# Patient Record
Sex: Male | Born: 1995 | Race: Black or African American | Hispanic: No | Marital: Single | Smoking: Never smoker
Health system: Southern US, Community
[De-identification: ages and names within clinical notes are randomized; demographics above are authoritative.]

## PROBLEM LIST (undated history)

## (undated) DIAGNOSIS — S83519A Sprain of anterior cruciate ligament of unspecified knee, initial encounter: Secondary | ICD-10-CM

## (undated) DIAGNOSIS — T7840XA Allergy, unspecified, initial encounter: Secondary | ICD-10-CM

## (undated) DIAGNOSIS — K219 Gastro-esophageal reflux disease without esophagitis: Secondary | ICD-10-CM

## (undated) DIAGNOSIS — S43109A Unspecified dislocation of unspecified acromioclavicular joint, initial encounter: Secondary | ICD-10-CM

## (undated) HISTORY — PX: SHOULDER ARTHROCENTESIS: SUR47

## (undated) HISTORY — DX: Morbid (severe) obesity due to excess calories: E66.01

## (undated) HISTORY — DX: Allergy, unspecified, initial encounter: T78.40XA

## (undated) HISTORY — PX: LAPAROSCOPIC CHOLECYSTECTOMY: SUR755

---
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
## 2018-08-20 ENCOUNTER — Telehealth: Payer: Self-pay | Admitting: Cardiovascular Disease

## 2018-08-20 NOTE — Telephone Encounter (Signed)
  Dr Seward MethBroadnax from [facility] will be sending over EKG for review. Call back number is [phone]

## 2018-08-20 NOTE — Telephone Encounter (Signed)
Dr Duke Salviaandolph spoke with Frederico HammanN [facility] and they will follow up with patient regarding bradycardia. They will call back if felt cardiology needed after follow up

## 2018-08-31 ENCOUNTER — Telehealth: Payer: Self-pay | Admitting: *Deleted

## 2018-08-31 NOTE — Telephone Encounter (Signed)
[facility] regarding possible follow up appointment/vital signs. Dr Duke Salviaandolph had spoken with NP about patients EKG last week. Dr Duke Salviaandolph recommended they follow up with patient and call our office if anything further needed. Left message to call back

## 2018-09-02 NOTE — Telephone Encounter (Signed)
New message   Per [facility] at [facility] is returning your call.  Please contact Natasha [facility]n[employer]. [phone].  Martie LeeSabrina is going on vacation after today and will not be back until January.

## 2018-09-03 NOTE — Telephone Encounter (Signed)
Left message for Samuel Logan to call back.

## 2018-09-04 NOTE — Telephone Encounter (Signed)
H Meng did review EKG and patient needs new patient visit  Information given to schedulers to arrange consult

## 2018-09-04 NOTE — Telephone Encounter (Signed)
Called had to leave a message to cal back

## 2018-09-17 ENCOUNTER — Telehealth: Payer: Self-pay | Admitting: Cardiovascular Disease

## 2018-09-17 NOTE — Telephone Encounter (Signed)
Called patient and LVM to call back and schedule appt.  

## 2018-09-22 NOTE — Telephone Encounter (Signed)
Patient has new patient appointment scheduled 10/16/18 with Dr Cristal Deer

## 2018-09-25 NOTE — Telephone Encounter (Signed)
Left message with student health appointment made

## 2018-10-16 ENCOUNTER — Encounter: Payer: Self-pay | Admitting: Cardiology

## 2018-10-16 ENCOUNTER — Ambulatory Visit (INDEPENDENT_AMBULATORY_CARE_PROVIDER_SITE_OTHER): Payer: Managed Care, Other (non HMO) | Admitting: Cardiology

## 2018-10-16 ENCOUNTER — Ambulatory Visit: Payer: Managed Care, Other (non HMO) | Admitting: Cardiology

## 2018-10-16 ENCOUNTER — Encounter (INDEPENDENT_AMBULATORY_CARE_PROVIDER_SITE_OTHER): Payer: Self-pay

## 2018-10-16 VITALS — BP 130/68 | HR 73 | Ht 72.0 in | Wt 265.2 lb

## 2018-10-16 DIAGNOSIS — R42 Dizziness and giddiness: Secondary | ICD-10-CM

## 2018-10-16 DIAGNOSIS — R001 Bradycardia, unspecified: Secondary | ICD-10-CM

## 2018-10-16 NOTE — Patient Instructions (Signed)
Medication Instructions:  Your Physician recommend you continue on your current medication as directed.    If you need a refill on your cardiac medications before your next appointment, please call your pharmacy.   Lab work: None  Testing/Procedures: None  Follow-Up: At [facility], you and your health needs are our priority.  As part of our continuing mission to provide you with exceptional heart care, we have created designated Provider Care Teams.  These Care Teams include your primary Cardiologist (physician) and Advanced Practice Providers (APPs -  Physician Assistants and Nurse Practitioners) who all work together to provide you with the care you need, when you need it. You will need a follow up appointment as needed.  Please call our office 2 months in advance to schedule this appointment.  You may see Dr. Christopher or one of the following Advanced Practice Providers on your designated Care Team:   Rhonda Barrett, PA-C . Kathryn Lawrence, DNP, ANP     

## 2018-10-16 NOTE — Progress Notes (Signed)
Cardiology Office Note:    Date:  10/16/2018   ID:  Samuel Logan, DOB July 04, 1996, MRN [id number]  PCP:  Santa Genera, MD  Cardiologist:  Jodelle Red, MD PhD  Referring MD: Georgia Lopes, FNP   CC: consult for bradycardia  History of Present Illness:    Samuel Logan is a 23 y.o. male without significant PMH who is seen as a new consult at the request of Georgia Lopes, FNP for the evaluation and management of bradycardia.  Was walking across campus in early December, felt lightheaded/like he might almost pass out walking to the student health [facility]. Had a headache, nausea as well. No upper respiratory symptoms. He was stressed, lack of sleep, working out for football at the time. He also endorsed feeling a sharp chest discomfort around the same time, occurred when he was lying in bed, better when he was working out He had an ECG done at the time, which showed sinus bradycardia at a rate of 46 bpm. Dr. Duke Salvia was called, who recommended repeat ECG/vitals at the health center in a week. This was done, repeat ECG sinus bradycardia at 51 bpm. He has been avoiding working out for football since that time.  No syncope since childhood. No further chest pain. Only heart racing when he is stressed out/before a big game. Denies shortness of breath at rest or with normal exertion. No PND, orthopnea, LE edema or unexpected weight gain.  All his grandparents have high blood pressures, as well as both of his parents.   Past Medical History:  Diagnosis Date  . AC separation 04/2012   left shoulder   . Acid reflux    no current med.  . ACL tear 06/12/2012   left knee    Past Surgical History:  Procedure Laterality Date  . LAPAROSCOPIC CHOLECYSTECTOMY  age 8    Current Medications: Current Outpatient Medications on File Prior to Visit  Medication Sig  . ibuprofen (ADVIL,MOTRIN) 800 MG tablet Take 1 tablet (800 mg total) by mouth every 8 (eight) hours as needed.   No  current facility-administered medications on file prior to visit.      Allergies:   Patient has no known allergies.   Social History   Socioeconomic History  . Marital status: Single    Spouse name: Not on file  . Number of children: Not on file  . Years of education: Not on file  . Highest education level: Not on file  Occupational History  . Not on file  Social Needs  . Financial resource strain: Not on file  . Food insecurity:    Worry: Not on file    Inability: Not on file  . Transportation needs:    Medical: Not on file    Non-medical: Not on file  Tobacco Use  . Smoking status: Passive Smoke Exposure - Never Smoker  . Smokeless tobacco: Never Used  . Tobacco comment: father smokes outside  Substance and Sexual Activity  . Alcohol use: No  . Drug use: No  . Sexual activity: Not on file  Lifestyle  . Physical activity:    Days per week: Not on file    Minutes per session: Not on file  . Stress: Not on file  Relationships  . Social connections:    Talks on phone: Not on file    Gets together: Not on file    Attends religious service: Not on file    Active member of club or organization: Not on file  Attends meetings of clubs or organizations: Not on file    Relationship status: Not on file  Other Topics Concern  . Not on file  Social History Narrative  . Not on file     Family History: The patient's family history includes Anesthesia problems in his father; Diabetes in his mother; Hypertension in his father and mother.  ROS:   Please see the history of present illness.  Additional pertinent ROS:  Constitutional: Negative for chills, fever, night sweats, unintentional weight loss  HENT: Negative for ear pain and hearing loss.   Eyes: Negative for loss of vision and eye pain.  Respiratory: Negative for cough, sputum, shortness of breath, wheezing.   Cardiovascular: Negative for chest pain, palpitations, PND, orthopnea, lower extremity edema and  claudication.  Gastrointestinal: Negative for abdominal pain, melena, and hematochezia.  Genitourinary: Negative for dysuria and hematuria.  Musculoskeletal: Negative for falls and myalgias.  Skin: Negative for itching and rash.  Neurological: Negative for focal weakness, focal sensory changes and loss of consciousness.  Endo/Heme/Allergies: Does not bruise/bleed easily.    EKGs/Labs/Other Studies Reviewed:    The following studies were reviewed today: 2 ECGS and [facility]  EKG:  EKG is personally reviewed.  The ekg ordered today demonstrates NSR, HR 73 bpm. Young age related repol pattern  Recent Labs: No results found for requested labs within last 8760 hours.  Recent Lipid Panel No results found for: CHOL, TRIG, HDL, CHOLHDL, VLDL, LDLCALC, LDLDIRECT  Physical Exam:    VS:  BP 130/68   Pulse 73   Ht 6' (1.829 m)   Wt 265 lb 3.2 oz (120.3 kg)   BMI 35.97 kg/m     Wt Readings from Last 3 Encounters:  10/16/18 265 lb 3.2 oz (120.3 kg)  05/14/14 218 lb 12.8 oz (99.2 kg) (99 %, Z= 2.20)*  03/25/13 205 lb (93 kg) (99 %, Z= 2.21)*   * Growth percentiles are based on CDC (Boys, 2-20 Years) data.     GEN: Well nourished, well developed in no acute distress HEENT: Normal NECK: No JVD; No carotid bruits LYMPHATICS: No lymphadenopathy CARDIAC: regular rhythm, normal S1 and S2, no murmurs, rubs, gallops. Radial and DP pulses 2+ bilaterally. RESPIRATORY:  Clear to auscultation without rales, wheezing or rhonchi  ABDOMEN: Soft, non-tender, non-distended MUSCULOSKELETAL:  No edema; No deformity  SKIN: Warm and dry NEUROLOGIC:  Alert and oriented x 3 PSYCHIATRIC:  Normal affect   ASSESSMENT:    1. Bradycardia   2. Episodic lightheadedness    PLAN:    Episode of lightheadedness, with noted sinus bradycardia: symptoms have resolved. No evidence of high grade conduction disease. No associated syncope or high risk symptoms. NSR today at 73 bpm. His  heart rate has gradually increased while abstaining from football workouts, which support that he has high resting vagal tone due to his age and athleticism.  We discussed multiple options for evaluation, and together it was decided that we will monitor his symptoms as he returns to activity. If symptoms recur, we will pursue a monitor to evaluate his rhythm patterns further. He will call us if he has any questions or concerns.  Plan for follow up: as needed  Medication Adjustments/Labs and Tests Ordered: Current medicines are reviewed at length with the patient today.  Concerns regarding medicines are outlined above.  Orders Placed This Encounter  Procedures  . EKG 12-Lead   No orders of the defined types were placed in this encounter.  Patient Instructions  Medication Instructions:  Your Physician recommend you continue on your current medication as directed.     If you need a refill on your cardiac medications before your next appointment, please call your pharmacy.   Lab work: None  Testing/Procedures: None    Follow-Up: At [facility], you and your health needs are our priority.  As part of our continuing mission to provide you with exceptional heart care, we have created designated Provider Care Teams.  These Care Teams include your primary Cardiologist (physician) and Advanced Practice Providers (APPs -  Physician Assistants and Nurse Practitioners) who all work together to provide you with the care you need, when you need it. You will need a follow up appointment as needed.  Please call our office 2 months in advance to schedule this appointment.  You may see Dr. Cristal Deer or one of the following Advanced Practice Providers on your designated Care Team:   Theodore Demark, PA-C . Joni Reining, DNP, ANP        Signed, Jodelle Red, MD PhD 10/16/2018 4:56 PM    [facility]

## 2018-10-22 ENCOUNTER — Ambulatory Visit: Payer: Managed Care, Other (non HMO) | Admitting: Cardiology

## 2018-11-13 ENCOUNTER — Encounter

## 2019-01-12 ENCOUNTER — Telehealth: Payer: Self-pay | Admitting: Emergency Medicine

## 2019-01-12 ENCOUNTER — Ambulatory Visit: Payer: Managed Care, Other (non HMO) | Admitting: Emergency Medicine

## 2019-01-12 NOTE — Telephone Encounter (Signed)
01/12/2019 - PATIENT HAD AN OFFICE VISIT SCHEDULED TO ESTABLISH CARE WITH DR. Irving Shows ON Tuesday 01/12/2019 AT 9:20am. HE WAS A NO-SHOW. I TRIED TO CALL AND RESCHEDULE BUT HAD TO LEAVE HIM A VOICE MAIL TO RETURN OUR CALL. MBC

## 2019-01-26 ENCOUNTER — Telehealth (INDEPENDENT_AMBULATORY_CARE_PROVIDER_SITE_OTHER): Payer: Managed Care, Other (non HMO) | Admitting: Emergency Medicine

## 2019-01-26 ENCOUNTER — Other Ambulatory Visit: Payer: Self-pay

## 2019-01-26 ENCOUNTER — Encounter: Payer: Self-pay | Admitting: Emergency Medicine

## 2019-01-26 DIAGNOSIS — Z7689 Persons encountering health services in other specified circumstances: Secondary | ICD-10-CM

## 2019-01-26 NOTE — Progress Notes (Signed)
Contacted patient to triage for appointment. Patient states he had a wisdom tooth pulled back in March 2020, it got infected, but no problem now. Patient wants to establish care, he has no complaints.

## 2019-01-26 NOTE — Progress Notes (Signed)
Telemedicine Encounter- SOAP NOTE Established Patient  This telephone encounter was conducted with the patient's (or proxy's) verbal consent via audio telecommunications: yes/no: Yes Patient was instructed to have this encounter in a suitably private space; and to only have persons present to whom they give permission to participate. In addition, patient identity was confirmed by use of name plus two identifiers (DOB and address).  I discussed the limitations, risks, security and privacy concerns of performing an evaluation and management service by telephone and the availability of in person appointments. I also discussed with the patient that there may be a patient responsible charge related to this service. The patient expressed understanding and agreed to proceed.  I spent a total of TIME; 0 MIN TO 60 MIN: 10 minutes talking with the patient or their proxy.  No chief complaint on file. Encounter to establish care  Subjective   Samuel Logan is a 23 y.o. male patient. Telephone visit today to establish care with me. No chronic medical problems.  No chronic medications.  Has no complaints or medical concerns today.  HPI   Patient Active Problem List   Diagnosis Date Noted  . Gait abnormality 11/17/2012  . ACL (anterior cruciate ligament) tear 11/17/2012  . Toe pain, left 09/04/2012  . Acromioclavicular joint pain 09/04/2012    Past Medical History:  Diagnosis Date  . AC separation 04/2012   left shoulder   . Acid reflux    no current med.  . ACL tear 06/12/2012   left knee    Current Outpatient Medications  Medication Sig Dispense Refill  . ibuprofen (ADVIL,MOTRIN) 800 MG tablet Take 1 tablet (800 mg total) by mouth every 8 (eight) hours as needed. (Patient not taking: Reported on 01/26/2019) 21 tablet 0   No current facility-administered medications for this visit.     No Known Allergies  Social History   Socioeconomic History  . Marital status: Single    Spouse  name: Not on file  . Number of children: Not on file  . Years of education: Not on file  . Highest education level: Not on file  Occupational History  . Not on file  Social Needs  . Financial resource strain: Not on file  . Food insecurity:    Worry: Not on file    Inability: Not on file  . Transportation needs:    Medical: Not on file    Non-medical: Not on file  Tobacco Use  . Smoking status: Passive Smoke Exposure - Never Smoker  . Smokeless tobacco: Never Used  . Tobacco comment: father smokes outside  Substance and Sexual Activity  . Alcohol use: No  . Drug use: No  . Sexual activity: Not on file  Lifestyle  . Physical activity:    Days per week: Not on file    Minutes per session: Not on file  . Stress: Not on file  Relationships  . Social connections:    Talks on phone: Not on file    Gets together: Not on file    Attends religious service: Not on file    Active member of club or organization: Not on file    Attends meetings of clubs or organizations: Not on file    Relationship status: Not on file  . Intimate partner violence:    Fear of current or ex partner: Not on file    Emotionally abused: Not on file    Physically abused: Not on file    Forced sexual  activity: Not on file  Other Topics Concern  . Not on file  Social History Narrative  . Not on file    Review of Systems  Constitutional: Negative.  Negative for chills, fever and weight loss.  HENT: Negative.  Negative for congestion, nosebleeds and sore throat.   Eyes: Negative.  Negative for blurred vision and double vision.  Respiratory: Negative.  Negative for cough and shortness of breath.   Cardiovascular: Negative.  Negative for chest pain and palpitations.  Gastrointestinal: Negative.  Negative for abdominal pain, blood in stool, diarrhea, melena, nausea and vomiting.  Genitourinary: Negative.   Musculoskeletal: Negative.  Negative for myalgias.  Skin: Negative.  Negative for rash.   Neurological: Negative.  Negative for dizziness and headaches.  All other systems reviewed and are negative.   Objective   Vitals as reported by the patient: None available 6 feet   weight 250 pounds Awake and oriented x3 in no apparent respiratory distress There were no vitals filed for this visit. Diagnoses and all orders for this visit:  Encounter to establish care   Clinically stable.  No medical concerns identified on today's visit.  There are no diagnoses linked to this encounter.   I discussed the assessment and treatment plan with the patient. The patient was provided an opportunity to ask questions and all were answered. The patient agreed with the plan and demonstrated an understanding of the instructions.   The patient was advised to call back or seek an in-person evaluation if the symptoms worsen or if the condition fails to improve as anticipated.  I provided 10 minutes of non-face-to-face time during this encounter.  Georgina Quint, MD  Primary Care at [facility]

## 2019-02-24 ENCOUNTER — Other Ambulatory Visit (HOSPITAL_COMMUNITY)
Admission: RE | Admit: 2019-02-24 | Discharge: 2019-02-24 | Disposition: A | Discharge status: Home / Self Care | Payer: Managed Care, Other (non HMO) | Source: Ambulatory Visit | Attending: Emergency Medicine | Admitting: Emergency Medicine

## 2019-02-24 ENCOUNTER — Ambulatory Visit (INDEPENDENT_AMBULATORY_CARE_PROVIDER_SITE_OTHER): Payer: Managed Care, Other (non HMO) | Admitting: Emergency Medicine

## 2019-02-24 ENCOUNTER — Encounter: Payer: Self-pay | Admitting: Emergency Medicine

## 2019-02-24 ENCOUNTER — Other Ambulatory Visit: Payer: Self-pay

## 2019-02-24 VITALS — BP 122/63 | HR 60 | Temp 98.8°F | Resp 16 | Ht 72.25 in | Wt 254.4 lb

## 2019-02-24 DIAGNOSIS — Z130 Encounter for screening for diseases of the blood and blood-forming organs and certain disorders involving the immune mechanism: Secondary | ICD-10-CM

## 2019-02-24 DIAGNOSIS — Z202 Contact with and (suspected) exposure to infections with a predominantly sexual mode of transmission: Secondary | ICD-10-CM | POA: Insufficient documentation

## 2019-02-24 DIAGNOSIS — Z0000 Encounter for general adult medical examination without abnormal findings: Secondary | ICD-10-CM

## 2019-02-24 DIAGNOSIS — Z13220 Encounter for screening for lipoid disorders: Secondary | ICD-10-CM

## 2019-02-24 DIAGNOSIS — Z1329 Encounter for screening for other suspected endocrine disorder: Secondary | ICD-10-CM

## 2019-02-24 DIAGNOSIS — Z13228 Encounter for screening for other metabolic disorders: Secondary | ICD-10-CM

## 2019-02-24 NOTE — Progress Notes (Signed)
Samuel GriffinsKyin X Logan 23 y.o.   Chief Complaint  Patient presents with  . Annual Exam    per patient wants testing for STD and no exposure    HISTORY OF PRESENT ILLNESS: This is a 23 y.o. male here for annual exam.  No chronic medical problems. Worried and concerned about possible STD exposure.  Asymptomatic. No other complaints or medical concerns.  HPI   Prior to Admission medications   Medication Sig Start Date End Date Taking? Authorizing Provider  ibuprofen (ADVIL,MOTRIN) 800 MG tablet Take 1 tablet (800 mg total) by mouth every 8 (eight) hours as needed. Patient not taking: Reported on 01/26/2019 11/18/15   Charlestine NightLawyer, Christopher, PA-C    No Known Allergies  Patient Active Problem List   Diagnosis Date Noted  . Gait abnormality 11/17/2012  . ACL (anterior cruciate ligament) tear 11/17/2012  . Toe pain, left 09/04/2012  . Acromioclavicular joint pain 09/04/2012    Past Medical History:  Diagnosis Date  . AC separation 04/2012   left shoulder   . Acid reflux    no current med.  . ACL tear 06/12/2012   left knee    Past Surgical History:  Procedure Laterality Date  . LAPAROSCOPIC CHOLECYSTECTOMY  age 89    Social History   Socioeconomic History  . Marital status: Single    Spouse name: Not on file  . Number of children: Not on file  . Years of education: Not on file  . Highest education level: Not on file  Occupational History  . Not on file  Social Needs  . Financial resource strain: Not on file  . Food insecurity:    Worry: Not on file    Inability: Not on file  . Transportation needs:    Medical: Not on file    Non-medical: Not on file  Tobacco Use  . Smoking status: Passive Smoke Exposure - Never Smoker  . Smokeless tobacco: Never Used  . Tobacco comment: father smokes outside  Substance and Sexual Activity  . Alcohol use: Yes    Alcohol/week: 1.0 standard drinks    Types: 1 Standard drinks or equivalent per week    Comment: 0-1 once every to 2 weeks   . Drug use: No  . Sexual activity: Not on file  Lifestyle  . Physical activity:    Days per week: Not on file    Minutes per session: Not on file  . Stress: Not on file  Relationships  . Social connections:    Talks on phone: Not on file    Gets together: Not on file    Attends religious service: Not on file    Active member of club or organization: Not on file    Attends meetings of clubs or organizations: Not on file    Relationship status: Not on file  . Intimate partner violence:    Fear of current or ex partner: Not on file    Emotionally abused: Not on file    Physically abused: Not on file    Forced sexual activity: Not on file  Other Topics Concern  . Not on file  Social History Narrative  . Not on file    Family History  Problem Relation Age of Onset  . Diabetes Mother   . Hypertension Mother   . Hypertension Father   . Anesthesia problems Father        post-op N/V     Review of Systems  Constitutional: Negative.  Negative for chills and  fever.  HENT: Negative.  Negative for sore throat.   Respiratory: Negative.  Negative for cough and shortness of breath.   Cardiovascular: Negative for chest pain and palpitations.  Gastrointestinal: Negative.  Negative for abdominal pain, diarrhea, nausea and vomiting.  Genitourinary: Negative.  Negative for dysuria, flank pain and hematuria.  Musculoskeletal: Negative.  Negative for back pain, myalgias and neck pain.  Skin: Negative.  Negative for rash.  Neurological: Negative.  Negative for dizziness and headaches.  Endo/Heme/Allergies: Negative.   All other systems reviewed and are negative.  Vitals:   02/24/19 1510  BP: 122/63  Pulse: 60  Resp: 16  Temp: 98.8 F (37.1 C)  SpO2: 98%     Physical Exam Vitals signs reviewed.  Constitutional:      Appearance: Normal appearance.  HENT:     Head: Normocephalic and atraumatic.     Nose: Nose normal.  Eyes:     Extraocular Movements: Extraocular movements  intact.     Pupils: Pupils are equal, round, and reactive to light.  Neck:     Musculoskeletal: Normal range of motion and neck supple.  Cardiovascular:     Rate and Rhythm: Normal rate and regular rhythm.     Heart sounds: Normal heart sounds.  Pulmonary:     Effort: Pulmonary effort is normal.     Breath sounds: Normal breath sounds.  Abdominal:     Tenderness: There is no abdominal tenderness.  Musculoskeletal: Normal range of motion.  Skin:    General: Skin is warm and dry.     Capillary Refill: Capillary refill takes less than 2 seconds.  Neurological:     General: No focal deficit present.     Mental Status: He is alert and oriented to person, place, and time.  Psychiatric:        Mood and Affect: Mood normal.        Behavior: Behavior normal.      ASSESSMENT & PLAN: Samuel Logan was seen today for annual exam.  Diagnoses and all orders for this visit:  Routine general medical examination at a health care facility  Possible exposure to STD -     STD Panel -     GC/Chlamydia probe amp ([place])[facility] at [facility]  Screening for deficiency anemia -     CBC with Differential/Platelet  Screening for lipoid disorders -     Lipid panel  Screening for endocrine, metabolic and immunity disorder -     Comprehensive metabolic panel -     Hemoglobin A1c    Patient Instructions       If you have lab work done today you will be contacted with your lab results within the next 2 weeks.  If you have not heard from us then please contact us. The fastest way to get your results is to register for My Chart.   IF you received an x-ray today, you will receive an invoice from [facility]. Please contact [facility] at [phone] with questions or concerns regarding your invoice.   IF you received labwork today, you will receive an invoice from [facility]. Please contact [facility] at [phone] with questions or concerns regarding your invoice.   Our billing staff  will not be able to assist you with questions regarding bills from these companies.  You will be contacted with the lab results as soon as they are available. The fastest way to get your results is to activate your My Chart account. Instructions are located on the last page  of this paperwork. If you have not heard from [place] regarding the results in 2 weeks, please contact this office.      Health Maintenance, Male A healthy lifestyle and preventive care is important for your health and wellness. Ask your health care provider about what schedule of regular examinations is right for you. What should I know about weight and diet? Eat a Healthy Diet  Eat plenty of vegetables, fruits, whole grains, low-fat dairy products, and lean protein.  Do not eat a lot of foods high in solid fats, added sugars, or salt.  Maintain a Healthy Weight Regular exercise can help you achieve or maintain a healthy weight. You should:  Do at least 150 minutes of exercise each week. The exercise should increase your heart rate and make you sweat (moderate-intensity exercise).  Do strength-training exercises at least twice a week. Watch Your Levels of Cholesterol and Blood Lipids  Have your blood tested for lipids and cholesterol every 5 years starting at 21 years of age. If you are at high risk for heart disease, you should start having your blood tested when you are 22 years old. You may need to have your cholesterol levels checked more often if: ? Your lipid or cholesterol levels are high. ? You are older than 21 years of age. ? You are at high risk for heart disease. What should I know about cancer screening? Many types of cancers can be detected early and may often be prevented. Lung Cancer  You should be screened every year for lung cancer if: ? You are a current smoker who has smoked for at least 30 years. ? You are a former smoker who has quit within the past 15 years.  Talk to your health care provider  about your screening options, when you should start screening, and how often you should be screened. Colorectal Cancer  Routine colorectal cancer screening usually begins at 21 years of age and should be repeated every 5-10 years until you are 21 years old. You may need to be screened more often if early forms of precancerous polyps or small growths are found. Your health care provider may recommend screening at an earlier age if you have risk factors for colon cancer.  Your health care provider may recommend using home test kits to check for hidden blood in the stool.  A small camera at the end of a tube can be used to examine your colon (sigmoidoscopy or colonoscopy). This checks for the earliest forms of colorectal cancer. Prostate and Testicular Cancer  Depending on your age and overall health, your health care provider may do certain tests to screen for prostate and testicular cancer.  Talk to your health care provider about any symptoms or concerns you have about testicular or prostate cancer. Skin Cancer  Check your skin from head to toe regularly.  Tell your health care provider about any new moles or changes in moles, especially if: ? There is a change in a mole's size, shape, or color. ? You have a mole that is larger than a pencil eraser.  Always use sunscreen. Apply sunscreen liberally and repeat throughout the day.  Protect yourself by wearing long sleeves, pants, a wide-brimmed hat, and sunglasses when outside. What should I know about heart disease, diabetes, and high blood pressure?  If you are 57-41 years of age, have your blood pressure checked every 3-5 years. If you are 85 years of age or older, have your blood pressure checked every year.  You should have your blood pressure measured twice-once when you are at a hospital or clinic, and once when you are not at a hospital or clinic. Record the average of the two measurements. To check your blood pressure when you are not  at a hospital or clinic, you can use: ? An automated blood pressure machine at a pharmacy. ? A home blood pressure monitor.  Talk to your health care provider about your target blood pressure.  If you are between 5945-21 years old, ask your health care provider if you should take aspirin to prevent heart disease.  Have regular diabetes screenings by checking your fasting blood sugar level. ? If you are at a normal weight and have a low risk for diabetes, have this test once every three years after the age of 21. ? If you are overweight and have a high risk for diabetes, consider being tested at a younger age or more often.  A one-time screening for abdominal aortic aneurysm (AAA) by ultrasound is recommended for men aged 65-75 years who are current or former smokers. What should I know about preventing infection? Hepatitis B If you have a higher risk for hepatitis B, you should be screened for this virus. Talk with your health care provider to find out if you are at risk for hepatitis B infection. Hepatitis C Blood testing is recommended for:  Everyone born from [birth year] through 1963.  Anyone with known risk factors for hepatitis C. Sexually Transmitted Diseases (STDs)  You should be screened each year for STDs including gonorrhea and chlamydia if: ? You are sexually active and are younger than 21 years of age. ? You are older than 21 years of age and your health care provider tells you that you are at risk for this type of infection. ? Your sexual activity has changed since you were last screened and you are at an increased risk for chlamydia or gonorrhea. Ask your health care provider if you are at risk.  Talk with your health care provider about whether you are at high risk of being infected with HIV. Your health care provider may recommend a prescription medicine to help prevent HIV infection. What else can I do?  Schedule regular health, dental, and eye exams.  Stay current with your  vaccines (immunizations).  Do not use any tobacco products, such as cigarettes, chewing tobacco, and e-cigarettes. If you need help quitting, ask your health care provider.  Limit alcohol intake to no more than 2 drinks per day. One drink equals 12 ounces of beer, 5 ounces of wine, or 1 ounces of hard liquor.  Do not use street drugs.  Do not share needles.  Ask your health care provider for help if you need support or information about quitting drugs.  Tell your health care provider if you often feel depressed.  Tell your health care provider if you have ever been abused or do not feel safe at home. This information is not intended to replace advice given to you by your health care provider. Make sure you discuss any questions you have with your health care provider. Document Released: 02/29/2008 Document Revised: 05/01/2016 Document Reviewed: 06/06/2015 Elsevier Interactive Patient Education  2019 Elsevier Inc.      Edwina BarthMiguel Edris Friedt, MD [facility]

## 2019-02-24 NOTE — Patient Instructions (Addendum)

## 2019-02-25 ENCOUNTER — Encounter: Payer: Self-pay | Admitting: Emergency Medicine

## 2019-02-26 LAB — CBC WITH DIFFERENTIAL/PLATELET
Basophils Absolute: 0 10*3/uL (ref 0.0–0.2)
Basos: 1 %
EOS (ABSOLUTE): 0.1 10*3/uL (ref 0.0–0.4)
Eos: 2 %
Hematocrit: 42.9 % (ref 37.5–51.0)
Hemoglobin: 15 g/dL (ref 13.0–17.7)
Immature Grans (Abs): 0 10*3/uL (ref 0.0–0.1)
Immature Granulocytes: 0 %
Lymphocytes Absolute: 1.5 10*3/uL (ref 0.7–3.1)
Lymphs: 36 %
MCH: 30 pg (ref 26.6–33.0)
MCHC: 35 g/dL (ref 31.5–35.7)
MCV: 86 fL (ref 79–97)
Monocytes Absolute: 0.6 10*3/uL (ref 0.1–0.9)
Monocytes: 14 %
Neutrophils Absolute: 1.9 10*3/uL (ref 1.4–7.0)
Neutrophils: 47 %
Platelets: 232 10*3/uL (ref 150–450)
RBC: 5 x10E6/uL (ref 4.14–5.80)
RDW: 13 % (ref 11.6–15.4)
WBC: 4.1 10*3/uL (ref 3.4–10.8)

## 2019-02-26 LAB — HEMOGLOBIN A1C
Est. average glucose Bld gHb Est-mCnc: 105 mg/dL
Hgb A1c MFr Bld: 5.3 % (ref 4.8–5.6)

## 2019-02-26 LAB — COMPREHENSIVE METABOLIC PANEL
ALT: 25 IU/L (ref 0–44)
AST: 32 IU/L (ref 0–40)
Albumin/Globulin Ratio: 1.7 (ref 1.2–2.2)
Albumin: 4.8 g/dL (ref 4.1–5.2)
Alkaline Phosphatase: 110 IU/L (ref 39–117)
BUN/Creatinine Ratio: 14 (ref 9–20)
BUN: 14 mg/dL (ref 6–20)
Bilirubin Total: 0.7 mg/dL (ref 0.0–1.2)
CO2: 21 mmol/L (ref 20–29)
Calcium: 9.6 mg/dL (ref 8.7–10.2)
Chloride: 103 mmol/L (ref 96–106)
Creatinine, Ser: 1.01 mg/dL (ref 0.76–1.27)
GFR calc Af Amer: 122 mL/min/{1.73_m2} (ref >59)
GFR calc non Af Amer: 106 mL/min/{1.73_m2} (ref >59)
Globulin, Total: 2.8 g/dL (ref 1.5–4.5)
Glucose: 87 mg/dL (ref 65–99)
Potassium: 4.4 mmol/L (ref 3.5–5.2)
Sodium: 140 mmol/L (ref 134–144)
Total Protein: 7.6 g/dL (ref 6.0–8.5)

## 2019-02-26 LAB — LIPID PANEL
Chol/HDL Ratio: 4.1 ratio (ref 0.0–5.0)
Cholesterol, Total: 158 mg/dL (ref 100–199)
HDL: 39 mg/dL — ABNORMAL LOW (ref >39)
LDL Calculated: 106 mg/dL — ABNORMAL HIGH (ref 0–99)
Triglycerides: 66 mg/dL (ref 0–149)
VLDL Cholesterol Cal: 13 mg/dL (ref 5–40)

## 2019-02-26 LAB — RPR+HSVIGM+HBSAG+HSV2(IGG)+...
HIV Screen 4th Generation wRfx: NONREACTIVE
HSV 2 IgG, Type Spec: <0.91 index (ref 0.00–0.90)
HSVI/II Comb IgM: <0.91 Ratio (ref 0.00–0.90)
Hepatitis B Surface Ag: NEGATIVE
RPR Ser Ql: NONREACTIVE

## 2019-02-26 LAB — GC/CHLAMYDIA PROBE AMP (~~LOC~~) NOT AT ARMC
Chlamydia: NEGATIVE
Neisseria Gonorrhea: NEGATIVE

## 2019-05-03 ENCOUNTER — Other Ambulatory Visit: Payer: Self-pay

## 2019-05-03 DIAGNOSIS — Z20822 Contact with and (suspected) exposure to covid-19: Secondary | ICD-10-CM

## 2019-05-05 LAB — NOVEL CORONAVIRUS, NAA: SARS-CoV-2, NAA: NOT DETECTED

## 2019-05-05 LAB — SPECIMEN STATUS REPORT

## 2019-12-26 ENCOUNTER — Other Ambulatory Visit: Payer: Self-pay

## 2019-12-26 ENCOUNTER — Ambulatory Visit (HOSPITAL_COMMUNITY)
Admission: EM | Admit: 2019-12-26 | Discharge: 2019-12-26 | Disposition: A | Discharge status: Home / Self Care | Payer: 59 | Attending: Physician Assistant | Admitting: Physician Assistant

## 2019-12-26 ENCOUNTER — Encounter (HOSPITAL_COMMUNITY): Payer: Self-pay

## 2019-12-26 DIAGNOSIS — U071 COVID-19: Secondary | ICD-10-CM | POA: Insufficient documentation

## 2019-12-26 DIAGNOSIS — J069 Acute upper respiratory infection, unspecified: Secondary | ICD-10-CM | POA: Diagnosis not present

## 2019-12-26 DIAGNOSIS — Z7722 Contact with and (suspected) exposure to environmental tobacco smoke (acute) (chronic): Secondary | ICD-10-CM | POA: Insufficient documentation

## 2019-12-26 DIAGNOSIS — R519 Headache, unspecified: Secondary | ICD-10-CM | POA: Diagnosis present

## 2019-12-26 LAB — SARS CORONAVIRUS 2 (TAT 6-24 HRS): SARS Coronavirus 2: POSITIVE — AB

## 2019-12-26 MED ORDER — SALINE SPRAY 0.65 % NA SOLN: 1.0000 | NASAL | 0 refills | Status: DC | PRN

## 2019-12-26 MED ORDER — CETIRIZINE HCL 10 MG PO TABS: 10.0000 mg | ORAL_TABLET | Freq: Every day | ORAL | 0 refills | Status: DC

## 2019-12-26 NOTE — ED Triage Notes (Signed)
Pt is here with a headache, chills that started yesterday, pt was COVID tested Friday his results were NEGATIVE. Pt has taken Alza-Selizer daytime pills to relieve discomfort.

## 2019-12-26 NOTE — Discharge Instructions (Signed)
Take ibuprofen and tylenol for your headache and bodyache, you may also continue the alka-seltzer if you feel this works  Start the zyrtec daily for allergy relief  Use the nasal saline daily as much as you like to aid in sinus relief  If your Covid-19 test is positive, you will receive a phone call from [facility] regarding your results. Negative test results are not called. Both positive and negative results area always visible on MyChart. If you do not have a MyChart account, sign up instructions are in your discharge papers.   Persons who are directed to care for themselves at home may discontinue isolation under the following conditions:   At least 10 days have passed since symptom onset and  At least 24 hours have passed without running a fever (this means without the use of fever-reducing medications) and  Other symptoms have improved.  Persons infected with COVID-19 who never develop symptoms may discontinue isolation and other precautions 10 days after the date of their first positive COVID-19 test.

## 2019-12-26 NOTE — ED Provider Notes (Signed)
[facility]    CSN: [id number] Arrival date & time: 12/26/19  1333      History   Chief Complaint Chief Complaint  Patient presents with  . Headache  . Chills    HPI Samuel Logan is a 24 y.o. male.   Patient reports with 1 day history of sinus headache, sinus congestion and runny nose and slight cough.  He also endorses body aches and chills yesterday.  Reports the headache has been in the frontal aspect of his head.  He reports the headache responded well to Alka-Seltzer.  He has had a runny nose and congestion with some mild facial pressure.  He is also been sneezing.  No eye itching or watery eyes.  Has had a slight cough that is nonproductive.  No shortness of breath.  No ear pain or sore throat.  No nausea, vomiting, diarrhea.  No change in taste or smell.  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  This Covid test was negative.  He believes this may have been a rapid test and there are pending PCR results.  He has been around anybody sick.  He is worried he may have a sinus infection.     Past Medical History:  Diagnosis Date  . AC separation 04/2012   left shoulder   . Acid reflux    no current med.  . ACL tear 06/12/2012   left knee  . Allergies     Patient Active Problem List   Diagnosis Date Noted  . Gait abnormality 11/17/2012  . ACL (anterior cruciate ligament) tear 11/17/2012  . Toe pain, left 09/04/2012  . Acromioclavicular joint pain 09/04/2012    Past Surgical History:  Procedure Laterality Date  . LAPAROSCOPIC CHOLECYSTECTOMY  age 68       Home Medications    Prior to Admission medications   Medication Sig Start Date End Date Taking? Authorizing Provider  cetirizine (ZYRTEC ALLERGY) 10 MG tablet Take 1 tablet (10 mg total) by mouth daily. 12/26/19   Milt Coye, Veryl Speak, PA-C  ibuprofen (ADVIL,MOTRIN) 800 MG tablet Take 1 tablet (800 mg total) by mouth every 8 (eight) hours as  needed. Patient not taking: Reported on 01/26/2019 11/18/15   Charlestine Night, PA-C  sodium chloride (OCEAN) 0.65 % SOLN nasal spray Place 1 spray into both nostrils as needed for congestion. 12/26/19   Keiron Iodice, Veryl Speak, PA-C    Family History Family History  Problem Relation Age of Onset  . Diabetes Mother   . Hypertension Mother   . Hypertension Father   . Anesthesia problems Father        post-op N/V  . Diabetes Maternal Grandfather   . Hypertension Maternal Grandfather   . Alcohol abuse Maternal Grandfather        per patient in the pass  . Diabetes Paternal Grandfather     Social History Social History   Tobacco Use  . Smoking status: Passive Smoke Exposure - Never Smoker  . Smokeless tobacco: Never Used  . Tobacco comment: father smokes outside  Substance Use Topics  . Alcohol use: Yes    Alcohol/week: 1.0 standard drinks    Types: 1 Standard drinks or equivalent per week    Comment: 0-1 once every to 2 weeks  . Drug use: No     Allergies   Patient has no known allergies.   Review of Systems Review of Systems  Constitutional: Positive for  chills. Negative for appetite change and fever.  HENT: Positive for congestion, rhinorrhea, sinus pressure and sneezing. Negative for ear pain, hearing loss, postnasal drip, sinus pain and sore throat.   Eyes: Negative for photophobia.  Respiratory: Positive for cough. Negative for chest tightness and shortness of breath.   Cardiovascular: Negative for chest pain.  Gastrointestinal: Negative for abdominal pain, constipation, nausea and vomiting.  Musculoskeletal: Positive for arthralgias and myalgias.  Skin: Negative for color change and rash.  Neurological: Positive for headaches. Negative for dizziness.  All other systems reviewed and are negative.    Physical Exam Triage Vital Signs ED Triage Vitals  Enc Vitals Group     BP 12/26/19 1403 (!) 141/59     Pulse Rate 12/26/19 1403 72     Resp 12/26/19 1403 19     Temp  12/26/19 1403 98.6 F (37 C)     Temp Source 12/26/19 1403 Oral     SpO2 12/26/19 1403 97 %     Weight 12/26/19 1402 260 lb (117.9 kg)     Height --      Head Circumference --      Peak Flow --      Pain Score 12/26/19 1402 0     Pain Loc --      Pain Edu? --      Excl. in GC? --    No data found.  Updated Vital Signs BP (!) 141/59 (BP Location: Right Arm)   Pulse 72   Temp 98.6 F (37 C) (Oral)   Resp 19   Wt 260 lb (117.9 kg)   SpO2 97%   BMI 35.02 kg/m   Visual Acuity Right Eye Distance:   Left Eye Distance:   Bilateral Distance:    Right Eye Near:   Left Eye Near:    Bilateral Near:     Physical Exam Vitals and nursing note reviewed.  Constitutional:      General: He is not in acute distress.    Appearance: He is well-developed. He is not ill-appearing.  HENT:     Head: Normocephalic and atraumatic.     Nose:     Right Sinus: No maxillary sinus tenderness or frontal sinus tenderness.     Left Sinus: No maxillary sinus tenderness or frontal sinus tenderness.     Comments: Turbinates erythematous and swollen with clear nasal discharge bilaterally.    Mouth/Throat:     Mouth: Mucous membranes are moist.     Pharynx: Oropharynx is clear.  Eyes:     Extraocular Movements: Extraocular movements intact.     Conjunctiva/sclera: Conjunctivae normal.     Pupils: Pupils are equal, round, and reactive to light.  Cardiovascular:     Rate and Rhythm: Normal rate and regular rhythm.     Heart sounds: No murmur.  Pulmonary:     Effort: Pulmonary effort is normal. No respiratory distress.     Breath sounds: Normal breath sounds. No wheezing, rhonchi or rales.  Musculoskeletal:     Cervical back: Neck supple.  Lymphadenopathy:     Cervical: No cervical adenopathy.  Skin:    General: Skin is warm and dry.  Neurological:     Mental Status: He is alert and oriented to person, place, and time.      UC Treatments / Results  Labs (all labs ordered are listed, but  only abnormal results are displayed) Labs Reviewed  SARS CORONAVIRUS 2 (TAT 6-24 HRS)    EKG   Radiology No  results found.  Procedures Procedures (including critical care time)  Medications Ordered in UC Medications - No data to display  Initial Impression / Assessment and Plan / UC Course  I have reviewed the triage vital signs and the nursing notes.  Pertinent labs & imaging results that were available during my care of the patient were reviewed by me and considered in my medical decision making (see chart for details).     #Viral URI Patient is 24 year old male presenting with acute viral upper respiratory infection.  He is afebrile and well-appearing.  Given patient had a negative Covid test prior to symptoms beginning did recommend we conduct Covid test today.  Patient does agree to this.  Discussed symptomatic care and that this is more than likely a virus and will not respond to antibiotic therapy.  Discussed continuing of Alka-Seltzer or use of Tylenol and ibuprofen for headache.  Discussed use of nasal saline for congestion.  Discussed starting Zyrtec for allergy symptoms.  Discussed that if not improving or acutely worsening over the next 7-10 days to please return for further evaluation.  Final Clinical Impressions(s) / UC Diagnoses   Final diagnoses:  Viral upper respiratory tract infection     Discharge Instructions     Take ibuprofen and tylenol for your headache and bodyache, you may also continue the alka-seltzer if you feel this works  Start the zyrtec daily for allergy relief  Use the nasal saline daily as much as you like to aid in sinus relief  If your Covid-19 test is positive, you will receive a phone call from [facility] regarding your results. Negative test results are not called. Both positive and negative results area always visible on MyChart. If you do not have a MyChart account, sign up instructions are in your discharge papers.   Persons  who are directed to care for themselves at home may discontinue isolation under the following conditions:  . At least 10 days have passed since symptom onset and . At least 24 hours have passed without running a fever (this means without the use of fever-reducing medications) and . Other symptoms have improved.  Persons infected with COVID-19 who never develop symptoms may discontinue isolation and other precautions 10 days after the date of their first positive COVID-19 test.     ED Prescriptions    Medication Sig Dispense Auth. Provider   cetirizine (ZYRTEC ALLERGY) 10 MG tablet Take 1 tablet (10 mg total) by mouth daily. 30 tablet Marlena Barbato, Marguerita Beards, PA-C   sodium chloride (OCEAN) 0.65 % SOLN nasal spray Place 1 spray into both nostrils as needed for congestion. 60 mL Nicha Hemann, Marguerita Beards, PA-C     PDMP not reviewed this encounter.   Purnell Shoemaker, PA-C 12/26/19 [phone]

## 2019-12-27 ENCOUNTER — Encounter: Payer: Self-pay | Admitting: Physician Assistant

## 2019-12-27 ENCOUNTER — Telehealth: Payer: Self-pay | Admitting: Adult Health

## 2019-12-27 ENCOUNTER — Telehealth: Payer: Self-pay | Admitting: Physician Assistant

## 2019-12-27 DIAGNOSIS — E669 Obesity, unspecified: Secondary | ICD-10-CM | POA: Insufficient documentation

## 2019-12-27 NOTE — Telephone Encounter (Signed)
Called to discuss with patient about Covid symptoms and the use of bamlanivimab/etesevimab or casirivimab/imdevimab, a monoclonal antibody infusion for those with mild to moderate Covid symptoms and at a high risk of hospitalization.  Pt is qualified for this infusion at the [facility] due to BMI>35   Message left to call back and also sent mychart message.  Richy Spradley PA-C  [facility]    

## 2019-12-27 NOTE — Telephone Encounter (Signed)
Called patient to discuss covid positivity and review eligibility for him to receive monoclonal antibody treatment at [facility].  He did not answer, and I was unable to leave a message.    Lillard Anes, NP

## 2020-01-17 ENCOUNTER — Encounter: Payer: Self-pay | Admitting: Cardiology

## 2020-01-17 NOTE — Telephone Encounter (Signed)
error 

## 2020-01-18 ENCOUNTER — Other Ambulatory Visit: Payer: Self-pay | Admitting: *Deleted

## 2020-01-18 DIAGNOSIS — U071 COVID-19: Secondary | ICD-10-CM

## 2020-02-01 ENCOUNTER — Ambulatory Visit: Payer: 59 | Attending: Family

## 2020-02-01 DIAGNOSIS — Z23 Encounter for immunization: Secondary | ICD-10-CM

## 2020-02-01 NOTE — Progress Notes (Signed)
   Covid-19 Vaccination Clinic  Name:  Samuel Logan    MRN: [id number] DOB: 30-Oct-1995  02/01/2020  Mr. Fetty was observed post Covid-19 immunization for 15 minutes without incident. He was provided with Vaccine Information Sheet and instruction to access the V-Safe system.   Mr. Tindol was instructed to call 911 with any severe reactions post vaccine: Marland Kitchen Difficulty breathing  . Swelling of face and throat  . A fast heartbeat  . A bad rash all over body  . Dizziness and weakness   Immunizations Administered    Name Date Dose VIS Date Route   Moderna COVID-19 Vaccine 02/01/2020  1:31 PM 0.5 mL 08/2019 Intramuscular   Manufacturer: Moderna   Lot: [id number]   NDC: 67014-103-01

## 2020-02-29 ENCOUNTER — Ambulatory Visit: Payer: 59 | Attending: Family

## 2020-02-29 DIAGNOSIS — Z23 Encounter for immunization: Secondary | ICD-10-CM

## 2020-02-29 NOTE — Progress Notes (Signed)
   Covid-19 Vaccination Clinic  Name:  Samuel Logan    MRN: [id number] DOB: 03-Jul-1996  02/29/2020  Mr. [name] was observed post Covid-19 immunization for 15 minutes without incident. He was provided with Vaccine Information Sheet and instruction to access the V-Safe system.   Mr. [name] was instructed to call 911 with any severe reactions post vaccine: Marland Kitchen Difficulty breathing  . Swelling of face and throat  . A fast heartbeat  . A bad rash all over body  . Dizziness and weakness   Immunizations Administered    Name Date Dose VIS Date Route   Moderna COVID-19 Vaccine 02/29/2020  1:07 PM 0.5 mL 08/2019 Intramuscular   Manufacturer: Gala Murdoch   Lot: [id number]   NDC: 23017-209-10

## 2020-04-02 ENCOUNTER — Ambulatory Visit (HOSPITAL_COMMUNITY)
Admission: EM | Admit: 2020-04-02 | Discharge: 2020-04-02 | Disposition: A | Discharge status: Home / Self Care | Payer: 59 | Attending: Urgent Care | Admitting: Urgent Care

## 2020-04-02 ENCOUNTER — Encounter (HOSPITAL_COMMUNITY): Payer: Self-pay

## 2020-04-02 DIAGNOSIS — M79641 Pain in right hand: Secondary | ICD-10-CM | POA: Diagnosis not present

## 2020-04-02 DIAGNOSIS — Z23 Encounter for immunization: Secondary | ICD-10-CM

## 2020-04-02 DIAGNOSIS — S61411A Laceration without foreign body of right hand, initial encounter: Secondary | ICD-10-CM

## 2020-04-02 MED ORDER — TETANUS-DIPHTH-ACELL PERTUSSIS 5-2.5-18.5 LF-MCG/0.5 IM SUSP
0.5000 mL | Freq: Once | INTRAMUSCULAR | Status: AC
  Administered 2020-04-02: 0.5 mL via INTRAMUSCULAR

## 2020-04-02 MED ORDER — TETANUS-DIPHTH-ACELL PERTUSSIS 5-2.5-18.5 LF-MCG/0.5 IM SUSP
INTRAMUSCULAR | Status: AC
  Filled 2020-04-02: qty 0.5

## 2020-04-02 NOTE — ED Provider Notes (Signed)
[facility]   MRN: [id number] DOB: May 05, 1996  Subjective:   Samuel Logan is a 24 y.o. male presenting for suffering a right hand laceration today while playing paint ball.  Patient slipped and fell landing on a rock with an open hand.  Cannot recall the last Tdap he had but thinks it was a long time ago.  No current facility-administered medications for this encounter.  Current Outpatient Medications:  .  cetirizine (ZYRTEC ALLERGY) 10 MG tablet, Take 1 tablet (10 mg total) by mouth daily., Disp: 30 tablet, Rfl: 0 .  ibuprofen (ADVIL,MOTRIN) 800 MG tablet, Take 1 tablet (800 mg total) by mouth every 8 (eight) hours as needed. (Patient not taking: Reported on 01/26/2019), Disp: 21 tablet, Rfl: 0 .  sodium chloride (OCEAN) 0.65 % SOLN nasal spray, Place 1 spray into both nostrils as needed for congestion., Disp: 60 mL, Rfl: 0   No Known Allergies  Past Medical History:  Diagnosis Date  . AC separation 04/2012   left shoulder   . Acid reflux    no current med.  . ACL tear 06/12/2012   left knee  . Allergies   . Morbid obesity (HCC)      Past Surgical History:  Procedure Laterality Date  . LAPAROSCOPIC CHOLECYSTECTOMY  age 73  . SHOULDER ARTHROCENTESIS      Family History  Problem Relation Age of Onset  . Diabetes Mother   . Hypertension Mother   . Hypertension Father   . Anesthesia problems Father        post-op N/V  . Diabetes Maternal Grandfather   . Hypertension Maternal Grandfather   . Alcohol abuse Maternal Grandfather        per patient in the pass  . Diabetes Paternal Grandfather     Social History   Tobacco Use  . Smoking status: Passive Smoke Exposure - Never Smoker  . Smokeless tobacco: Never Used  . Tobacco comment: father smokes outside  Substance Use Topics  . Alcohol use: Yes    Alcohol/week: 1.0 standard drink    Types: 1 Standard drinks or equivalent per week    Comment: 0-1 once every to 2 weeks  . Drug use: No     ROS   Objective:   Vitals: BP 136/70 (BP Location: Left Arm)   Pulse (!) 51   Temp 98.7 F (37.1 C) (Oral)   Resp 18   SpO2 100%   Physical Exam Constitutional:      General: He is not in acute distress.    Appearance: Normal appearance. He is well-developed and normal weight. He is not ill-appearing, toxic-appearing or diaphoretic.  HENT:     Head: Normocephalic and atraumatic.     Right Ear: External ear normal.     Left Ear: External ear normal.     Nose: Nose normal.     Mouth/Throat:     Pharynx: Oropharynx is clear.  Eyes:     General: No scleral icterus.       Right eye: No discharge.        Left eye: No discharge.     Extraocular Movements: Extraocular movements intact.     Pupils: Pupils are equal, round, and reactive to light.  Cardiovascular:     Rate and Rhythm: Normal rate.  Pulmonary:     Effort: Pulmonary effort is normal.  Musculoskeletal:     Right hand: Laceration and tenderness (About his wound only) present. No swelling, deformity or bony tenderness. Normal range  of motion. Normal strength. Normal sensation. Normal capillary refill.       Hands:     Cervical back: Normal range of motion.  Neurological:     Mental Status: He is alert and oriented to person, place, and time.  Psychiatric:        Mood and Affect: Mood normal.        Behavior: Behavior normal.        Thought Content: Thought content normal.        Judgment: Judgment normal.     PROCEDURE NOTE: laceration repair Verbal consent obtained from patient.  Local anesthesia with 10cc Lidocaine 1% with epinephrine.  Wound explored for tendon, ligament damage. Wound scrubbed with soap and water and rinsed. Wound closed with #4 4-0 Prolene (3 horizontal mattress, 1 simple interrupted) sutures.  Wound cleansed and dressed.   Assessment and Plan :   PDMP not reviewed this encounter.  1. Right hand pain   2. Laceration of right hand without foreign body, initial encounter   3. Need  for diphtheria-tetanus-pertussis (Tdap) vaccine     Laceration repaired successfully. Wound care reviewed. Return-to-clinic precautions discussed, patient verbalized understanding. Otherwise, follow up in 10 days for suture removal.     Wallis Bamberg, PA-C 04/02/20 1707

## 2020-04-02 NOTE — ED Triage Notes (Signed)
Pt c/o laceration to right hand on a rock while playing paintball approx 1 hour PTA.  Laceration to right hand medial border noted approx 5cm long, edges approximate well, no active bleeding; pt able to make a fist and extend fingers and rotate hand/wrist.  Last tetanus unknown.

## 2020-04-02 NOTE — Discharge Instructions (Signed)

## 2020-11-09 ENCOUNTER — Emergency Department (HOSPITAL_BASED_OUTPATIENT_CLINIC_OR_DEPARTMENT_OTHER): Payer: 59

## 2020-11-09 ENCOUNTER — Emergency Department (HOSPITAL_COMMUNITY)
Admission: EM | Admit: 2020-11-09 | Discharge: 2020-11-09 | Disposition: A | Discharge status: Home / Self Care | Payer: 59 | Attending: Emergency Medicine | Admitting: Emergency Medicine

## 2020-11-09 ENCOUNTER — Emergency Department (HOSPITAL_COMMUNITY): Payer: 59

## 2020-11-09 ENCOUNTER — Encounter (HOSPITAL_COMMUNITY): Payer: Self-pay | Admitting: Emergency Medicine

## 2020-11-09 DIAGNOSIS — E860 Dehydration: Secondary | ICD-10-CM | POA: Insufficient documentation

## 2020-11-09 DIAGNOSIS — Z7722 Contact with and (suspected) exposure to environmental tobacco smoke (acute) (chronic): Secondary | ICD-10-CM | POA: Diagnosis not present

## 2020-11-09 DIAGNOSIS — R55 Syncope and collapse: Secondary | ICD-10-CM | POA: Diagnosis not present

## 2020-11-09 DIAGNOSIS — R252 Cramp and spasm: Secondary | ICD-10-CM

## 2020-11-09 LAB — HEPATIC FUNCTION PANEL
ALT: 23 U/L (ref 0–44)
AST: 36 U/L (ref 15–41)
Albumin: 4 g/dL (ref 3.5–5.0)
Alkaline Phosphatase: 78 U/L (ref 38–126)
Bilirubin, Direct: 0.2 mg/dL (ref 0.0–0.2)
Indirect Bilirubin: 1 mg/dL — ABNORMAL HIGH (ref 0.3–0.9)
Total Bilirubin: 1.2 mg/dL (ref 0.3–1.2)
Total Protein: 6.9 g/dL (ref 6.5–8.1)

## 2020-11-09 LAB — URINALYSIS, ROUTINE W REFLEX MICROSCOPIC
Bilirubin Urine: NEGATIVE
Glucose, UA: NEGATIVE mg/dL
Hgb urine dipstick: NEGATIVE
Ketones, ur: NEGATIVE mg/dL
Leukocytes,Ua: NEGATIVE
Nitrite: NEGATIVE
Protein, ur: NEGATIVE mg/dL
Specific Gravity, Urine: 1.013 (ref 1.005–1.030)
pH: 7 (ref 5.0–8.0)

## 2020-11-09 LAB — TROPONIN I (HIGH SENSITIVITY): Troponin I (High Sensitivity): 7 ng/L (ref <18)

## 2020-11-09 LAB — CBC WITH DIFFERENTIAL/PLATELET
Abs Immature Granulocytes: 0.02 10*3/uL (ref 0.00–0.07)
Basophils Absolute: 0 10*3/uL (ref 0.0–0.1)
Basophils Relative: 1 %
Eosinophils Absolute: 0.1 10*3/uL (ref 0.0–0.5)
Eosinophils Relative: 2 %
HCT: 41.8 % (ref 39.0–52.0)
Hemoglobin: 14.2 g/dL (ref 13.0–17.0)
Immature Granulocytes: 1 %
Lymphocytes Relative: 28 %
Lymphs Abs: 1.2 10*3/uL (ref 0.7–4.0)
MCH: 30 pg (ref 26.0–34.0)
MCHC: 34 g/dL (ref 30.0–36.0)
MCV: 88.4 fL (ref 80.0–100.0)
Monocytes Absolute: 0.4 10*3/uL (ref 0.1–1.0)
Monocytes Relative: 10 %
Neutro Abs: 2.5 10*3/uL (ref 1.7–7.7)
Neutrophils Relative %: 58 %
Platelets: 210 10*3/uL (ref 150–400)
RBC: 4.73 MIL/uL (ref 4.22–5.81)
RDW: 13.2 % (ref 11.5–15.5)
WBC: 4.2 10*3/uL (ref 4.0–10.5)
nRBC: 0 % (ref 0.0–0.2)

## 2020-11-09 LAB — BASIC METABOLIC PANEL
Anion gap: 12 (ref 5–15)
BUN: 11 mg/dL (ref 6–20)
CO2: 22 mmol/L (ref 22–32)
Calcium: 9.5 mg/dL (ref 8.9–10.3)
Chloride: 103 mmol/L (ref 98–111)
Creatinine, Ser: 1.34 mg/dL — ABNORMAL HIGH (ref 0.61–1.24)
GFR, Estimated: >60 mL/min (ref >60)
Glucose, Bld: 96 mg/dL (ref 70–99)
Potassium: 3.6 mmol/L (ref 3.5–5.1)
Sodium: 137 mmol/L (ref 135–145)

## 2020-11-09 LAB — ECHOCARDIOGRAM COMPLETE
Area-P 1/2: 1.92 cm2
S' Lateral: 3.9 cm

## 2020-11-09 LAB — CK: Total CK: 651 U/L — ABNORMAL HIGH (ref 49–397)

## 2020-11-09 LAB — CBG MONITORING, ED: Glucose-Capillary: 99 mg/dL (ref 70–99)

## 2020-11-09 MED ORDER — SODIUM CHLORIDE 0.9 % IV BOLUS
500.0000 mL | Freq: Once | INTRAVENOUS | Status: AC
  Administered 2020-11-09: 500 mL via INTRAVENOUS

## 2020-11-09 MED ORDER — DEXTROSE 10 % IV BOLUS
250.0000 mL | Freq: Once | INTRAVENOUS | Status: AC
  Administered 2020-11-09: 250 mL via INTRAVENOUS

## 2020-11-09 MED ORDER — SODIUM CHLORIDE 0.9 % IV BOLUS
1000.0000 mL | Freq: Once | INTRAVENOUS | Status: AC
  Administered 2020-11-09: 1000 mL via INTRAVENOUS

## 2020-11-09 NOTE — Progress Notes (Signed)
  Echocardiogram 2D Echocardiogram has been performed.  Samuel Logan 11/09/2020, 10:31 AM

## 2020-11-09 NOTE — ED Notes (Signed)
Pt much more awake. Able to answer questions. States "my legs and back hurt"

## 2020-11-09 NOTE — ED Provider Notes (Signed)
[facility] EMERGENCY DEPARTMENT Provider Note   CSN: [id number] Arrival date & time: 11/09/20  0757     History Chief Complaint  Patient presents with  . Loss of Consciousness    DEACON GADBOIS is a 25 y.o. male.  Level 5 caveat due to altered mental status.  Patient per EMS and football team trainer are able to provide history.  Patient is 25 years old with no significant medical history.  Patient please for local college football team.  They were at the tail end of a workout when he started to complain of leg pain bilaterally.  He then developed some severe cramps in his lower legs.  They took him back to training room when he suddenly fell out and has been very weak and too tired to talk.  Patient does not answer questions.  Trainer states he did not hit his head.  He did not pass out while he was actually working out.  Had Covid last April.  No seizure-like activity.  No history of seizures.  The history is provided by the EMS personnel.  Loss of Consciousness Episode history:  Single Progression:  Resolved Context: dehydration (fatigue after workout)   Witnessed: yes   Relieved by:  Nothing Worsened by:  Nothing Risk factors: no seizures        Past Medical History:  Diagnosis Date  . AC separation 04/2012   left shoulder   . Acid reflux    no current med.  . ACL tear 06/12/2012   left knee  . Allergies   . Morbid obesity [facility])     Patient Active Problem List   Diagnosis Date Noted  . Morbid obesity (HCC)   . Gait abnormality 11/17/2012  . ACL (anterior cruciate ligament) tear 11/17/2012  . Toe pain, left 09/04/2012  . Acromioclavicular joint pain 09/04/2012    Past Surgical History:  Procedure Laterality Date  . LAPAROSCOPIC CHOLECYSTECTOMY  age 53  . SHOULDER ARTHROCENTESIS         Family History  Problem Relation Age of Onset  . Diabetes Mother   . Hypertension Mother   . Hypertension Father   . Anesthesia problems Father         post-op N/V  . Diabetes Maternal Grandfather   . Hypertension Maternal Grandfather   . Alcohol abuse Maternal Grandfather        per patient in the pass  . Diabetes Paternal Grandfather     Social History   Tobacco Use  . Smoking status: Passive Smoke Exposure - Never Smoker  . Smokeless tobacco: Never Used  . Tobacco comment: father smokes outside  Substance Use Topics  . Alcohol use: Yes    Alcohol/week: 1.0 standard drink    Types: 1 Standard drinks or equivalent per week    Comment: 0-1 once every to 2 weeks  . Drug use: No    Home Medications Prior to Admission medications   Medication Sig Start Date End Date Taking? Authorizing Provider  cetirizine (ZYRTEC ALLERGY) 10 MG tablet Take 1 tablet (10 mg total) by mouth daily. 12/26/19   Darr, Gerilyn Pilgrim, PA-C  ibuprofen (ADVIL,MOTRIN) 800 MG tablet Take 1 tablet (800 mg total) by mouth every 8 (eight) hours as needed. Patient not taking: Reported on 01/26/2019 11/18/15   Charlestine Night, PA-C  sodium chloride (OCEAN) 0.65 % SOLN nasal spray Place 1 spray into both nostrils as needed for congestion. 12/26/19   Darr, Gerilyn Pilgrim, PA-C    Allergies  Patient has no known allergies.  Review of Systems   Review of Systems  Unable to perform ROS: Mental status change  Cardiovascular: Positive for syncope.    Physical Exam Updated Vital Signs BP 131/66 (BP Location: Right Arm)   Pulse (!) 44   Temp 98.6 F (37 C) (Oral)   Resp 16   SpO2 99%   Physical Exam Vitals and nursing note reviewed.  Constitutional:      Appearance: He is well-developed and well-nourished. He is ill-appearing.  HENT:     Head: Normocephalic and atraumatic.     Mouth/Throat:     Mouth: Mucous membranes are dry.  Eyes:     Extraocular Movements: Extraocular movements intact.     Conjunctiva/sclera: Conjunctivae normal.     Pupils: Pupils are equal, round, and reactive to light.  Cardiovascular:     Rate and Rhythm: Normal rate and regular rhythm.      Pulses: Normal pulses.     Heart sounds: No murmur (no obvious murmur) heard.   Pulmonary:     Effort: Pulmonary effort is normal. No respiratory distress.     Breath sounds: Normal breath sounds.  Abdominal:     Palpations: Abdomen is soft.     Tenderness: There is no abdominal tenderness.  Musculoskeletal:        General: No edema.     Cervical back: Neck supple.  Skin:    General: Skin is warm and dry.  Neurological:     Mental Status: He is alert.     Comments: Patient moves all extremities to pain but does not follow commands, opens eyes spontaneously  Psychiatric:        Mood and Affect: Mood and affect normal.     ED Results / Procedures / Treatments   Labs (all labs ordered are listed, but only abnormal results are displayed) Labs Reviewed  BASIC METABOLIC PANEL - Abnormal; Notable for the following components:      Result Value   Creatinine, Ser 1.34 (*)    All other components within normal limits  CK - Abnormal; Notable for the following components:   Total CK 651 (*)    All other components within normal limits  HEPATIC FUNCTION PANEL - Abnormal; Notable for the following components:   Indirect Bilirubin 1.0 (*)    All other components within normal limits  URINE CULTURE  CBC WITH DIFFERENTIAL/PLATELET  URINALYSIS, ROUTINE W REFLEX MICROSCOPIC  CBG MONITORING, ED  TROPONIN I (HIGH SENSITIVITY)    EKG EKG Interpretation  Date/Time:  Thursday November 09 2020 08:22:04 EST Ventricular Rate:  46 PR Interval:    QRS Duration: 100 QT Interval:  439 QTC Calculation: 384 R Axis:   104 Text Interpretation: Sinus bradycardia Borderline right axis deviation Borderline ST elevation, lateral leads Confirmed by Virgina Norfolk [phone]) on 11/09/2020 9:39:40 AM   Radiology CT Head Wo Contrast  Result Date: 11/09/2020 CLINICAL DATA:  Altered mental status with syncope and dizziness EXAM: CT HEAD WITHOUT CONTRAST TECHNIQUE: Contiguous axial images were obtained  from the base of the skull through the vertex without intravenous contrast. COMPARISON:  September 02, 2006 FINDINGS: Brain: Ventricles and sulci are normal in size and configuration. There is no intracranial mass, hemorrhage, extra-axial fluid collection, or midline shift. Brain parenchyma appears unremarkable. No findings indicative of acute infarct. Vascular: No hyperdense vessel.  No evident vascular calcification. Skull: Bony calvarium appears intact. Sinuses/Orbits: There is opacification throughout much of the right maxillary antrum as well as  opacification of several ethmoid air cells on the right. There is mucosal thickening in portions of the inferior right frontal sinus. Orbits appear symmetric bilaterally. Other: Mastoid air cells which are visualized are clear. IMPRESSION: Paranasal sinus disease at several sites. Brain parenchyma appears unremarkable. No mass, hemorrhage, or extra-axial fluid collection. Electronically Signed   By: Bretta Bang III M.D.   On: 11/09/2020 09:31   DG Chest Portable 1 View  Result Date: 11/09/2020 CLINICAL DATA:  Weakness. EXAM: PORTABLE CHEST 1 VIEW COMPARISON:  None. FINDINGS: The heart size and mediastinal contours are within normal limits. Both lungs are clear. The visualized skeletal structures are unremarkable. IMPRESSION: No active disease. Electronically Signed   By: Marlan Palau M.D.   On: 11/09/2020 10:27   ECHOCARDIOGRAM COMPLETE  Result Date: 11/09/2020    ECHOCARDIOGRAM REPORT   Patient Name:   TYLEE NEWBY Date of Exam: 11/09/2020 Medical Rec #:  [id number]     Height:       72.2 in Accession #:    [id number]    Weight:       260.0 lb Date of Birth:  07/14/1996     BSA:          2.388 m Patient Age:    25 years      BP:           129/74 mmHg Patient Gender: M             HR:           47 bpm. Exam Location:  Inpatient Procedure: 2D Echo, Cardiac Doppler and Color Doppler                     STAT ECHO Reported to: Dr. Anne Fu on 11/09/2020 10:24:00  AM. Indications:    R55 Syncope  History:        Patient has no prior history of Echocardiogram examinations.                 Risk Factors:GERD. COVID-19.  Sonographer:    Tiffany Dance Referring Phys: [id number] Mung Rinker IMPRESSIONS  1. Left ventricular ejection fraction, by estimation, is 55 to 60%. The left ventricle has normal function. The left ventricle has no regional wall motion abnormalities. Left ventricular diastolic parameters were normal.  2. Right ventricular systolic function is normal. The right ventricular size is normal.  3. Left atrial size was mildly dilated.  4. The mitral valve is normal in structure. No evidence of mitral valve regurgitation. No evidence of mitral stenosis.  5. The aortic valve is normal in structure. Aortic valve regurgitation is not visualized. No aortic stenosis is present.  6. The inferior vena cava is normal in size with greater than 50% respiratory variability, suggesting right atrial pressure of 3 mmHg. FINDINGS  Left Ventricle: Left ventricular ejection fraction, by estimation, is 55 to 60%. The left ventricle has normal function. The left ventricle has no regional wall motion abnormalities. The left ventricular internal cavity size was normal in size. There is  no left ventricular hypertrophy. Left ventricular diastolic parameters were normal. Right Ventricle: The right ventricular size is normal. No increase in right ventricular wall thickness. Right ventricular systolic function is normal. Left Atrium: Left atrial size was mildly dilated. Right Atrium: Right atrial size was normal in size. Pericardium: There is no evidence of pericardial effusion. Mitral Valve: The mitral valve is normal in structure. No evidence of mitral valve regurgitation. No evidence of mitral valve stenosis.  Tricuspid Valve: The tricuspid valve is normal in structure. Tricuspid valve regurgitation is trivial. No evidence of tricuspid stenosis. Aortic Valve: The aortic valve is normal in  structure. Aortic valve regurgitation is not visualized. No aortic stenosis is present. Pulmonic Valve: The pulmonic valve was normal in structure. Pulmonic valve regurgitation is not visualized. No evidence of pulmonic stenosis. Aorta: The aortic root is normal in size and structure. Venous: The inferior vena cava is normal in size with greater than 50% respiratory variability, suggesting right atrial pressure of 3 mmHg. IAS/Shunts: No atrial level shunt detected by color flow Doppler.  LEFT VENTRICLE PLAX 2D LVIDd:         5.20 cm  Diastology LVIDs:         3.90 cm  LV e' medial:    8.59 cm/s LV PW:         1.00 cm  LV E/e' medial:  9.7 LV IVS:        1.00 cm  LV e' lateral:   14.60 cm/s LVOT diam:     2.60 cm  LV E/e' lateral: 5.7 LV SV:         114 LV SV Index:   48 LVOT Area:     5.31 cm  RIGHT VENTRICLE             IVC RV Basal diam:  3.50 cm     IVC diam: 2.10 cm RV Mid diam:    3.00 cm RV S prime:     14.10 cm/s TAPSE (M-mode): 2.8 cm LEFT ATRIUM              Index       RIGHT ATRIUM           Index LA diam:        5.00 cm  2.09 cm/m  RA Area:     18.30 cm LA Vol (A2C):   77.5 ml  32.46 ml/m RA Volume:   49.50 ml  20.73 ml/m LA Vol (A4C):   101.0 ml 42.30 ml/m LA Biplane Vol: 91.3 ml  38.24 ml/m  AORTIC VALVE LVOT Vmax:   101.00 cm/s LVOT Vmean:  67.900 cm/s LVOT VTI:    0.215 m  AORTA Ao Root diam: 3.50 cm Ao Asc diam:  2.90 cm MITRAL VALVE MV Area (PHT): 1.92 cm    SHUNTS MV Decel Time: 396 msec    Systemic VTI:  0.22 m MV E velocity: 83.20 cm/s  Systemic Diam: 2.60 cm MV A velocity: 41.30 cm/s MV E/A ratio:  2.01 Donato SchultzMark Skains MD Electronically signed by Donato SchultzMark Skains MD Signature Date/Time: 11/09/2020/10:50:37 AM    Final     Procedures Procedures   Medications Ordered in ED Medications  sodium chloride 0.9 % bolus 1,000 mL (0 mLs Intravenous Stopped 11/09/20 1017)  dextrose (D10W) 10% bolus 250 mL (0 mLs Intravenous Stopped 11/09/20 1017)  sodium chloride 0.9 % bolus 500 mL (500 mLs  Intravenous New Bag/Given 11/09/20 1017)    ED Course  I have reviewed the triage vital signs and the nursing notes.  Pertinent labs & imaging results that were available during my care of the patient were reviewed by me and considered in my medical decision making (see chart for details).    MDM Rules/Calculators/A&P                          Ether GriffinsKyin X Naval is a 25 year old male with no significant medical history  presents the ED after having severe leg cramps, syncopal type event today.  Normal vitals.  No fever.  Patient overall is weak and not answering questions.  No seizure-like activity upon arrival.  Blood sugar normal with EMS.  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  Sports trainer is here who states that towards the end of morning workout patient started to complain of lower leg pain and spasms and cramps.  They took him back to the training room where he appeared to have fallen weak and passed out but did not hit his head.  He has been too weak to talk since.  He was not having any chest pain.  He did not pass out while running.  He appears to move all extremities to pain.  Opens his eyes spontaneously but seems confused.  Patient has normal temperature.  Could be heat related symptoms.  Will give fluid bolus and check basic labs including a CK.  EKG shows sinus bradycardia.  Has some ST changes anteriorly.  Some Q waves in lead III and laterally.  Talk to cardiology on the phone and recommend stat echocardiogram but overall nonspecific EKG findings.  Suspect possibly rhabdomyolysis versus heat exhaustion.  EKG does not appear consistent with HOCM but will get echocardiogram.  CK only mildly elevated in the 600s.  Creatinine 1.3.  Urinalysis unremarkable.  Troponin normal.  Chest x-ray without signs of pneumonia or pneumothorax.  No significant anemia or electrolyte abnormality otherwise.  No significant leukocytosis.  Patient feeling much better after 2 L IV fluids.  Echocardiogram  was normal.  No hypertrophic cardiomyopathy changes, no effusion, no wall motion abnormalities.  Overall suspect some heat exhaustion.  Recommend continued hydration at home and to continue to work with trainers about when to resume activities.  Discharged in good condition.  Understands return precautions.  This chart was dictated using voice recognition software.  Despite best efforts to proofread,  errors can occur which can change the documentation meaning.    Final Clinical Impression(s) / ED Diagnoses Final diagnoses:  Cramps, extremity  Syncope, unspecified syncope type  Dehydration    Rx / DC Orders ED Discharge Orders    None       Virgina Norfolk, DO 11/09/20 1201

## 2020-11-09 NOTE — Discharge Instructions (Signed)
Continue to hydrate and rest.  Work with team trainers about when to resume activities.  Please return if symptoms worsen.

## 2020-11-09 NOTE — ED Triage Notes (Signed)
Pt here from school / foot ball practice , where he had a syncopal episode , cbg wnl , pt states that he feels weak ,

## 2020-11-10 LAB — URINE CULTURE: Culture: NO GROWTH

## 2022-05-21 ENCOUNTER — Ambulatory Visit (HOSPITAL_COMMUNITY)
Admission: RE | Admit: 2022-05-21 | Discharge: 2022-05-21 | Disposition: A | Discharge status: Home / Self Care | Payer: BC Managed Care – PPO | Source: Ambulatory Visit | Attending: Emergency Medicine | Admitting: Emergency Medicine

## 2022-05-21 ENCOUNTER — Encounter (HOSPITAL_COMMUNITY): Payer: Self-pay

## 2022-05-21 VITALS — BP 133/77 | HR 58 | Temp 98.1°F | Resp 18

## 2022-05-21 DIAGNOSIS — Z113 Encounter for screening for infections with a predominantly sexual mode of transmission: Secondary | ICD-10-CM | POA: Diagnosis not present

## 2022-05-21 DIAGNOSIS — Z202 Contact with and (suspected) exposure to infections with a predominantly sexual mode of transmission: Secondary | ICD-10-CM

## 2022-05-21 NOTE — ED Triage Notes (Signed)
Pt requesting STD testing, denies sx's. States his partner has a yeast infection.

## 2022-05-21 NOTE — Discharge Instructions (Addendum)
We will call you if anything on your swab returns positive. Please abstain from sexual intercourse until your results return.  Please follow up with your new primary care provider regarding your sleep apnea symptoms.

## 2022-05-21 NOTE — ED Provider Notes (Signed)
[facility]    CSN: [id number] Arrival date & time: 05/21/22  0818      History   Chief Complaint Chief Complaint  Patient presents with   Exposure to STD    Wants std screening - Entered by patient   SEXUALLY TRANSMITTED DISEASE    HPI Samuel Logan is a 26 y.o. male.  Presents for STD testing. Denies any symptoms or known exposures. Would like blood work as well.  Additionally reports sleep apnea and would like to speak with a provider about this.  He does not have a primary care provider  Past Medical History:  Diagnosis Date   AC separation 04/2012   left shoulder    Acid reflux    no current med.   ACL tear 06/12/2012   left knee   Allergies    Morbid obesity [facility])     Patient Active Problem List   Diagnosis Date Noted   Morbid obesity (HCC)    Gait abnormality 11/17/2012   ACL (anterior cruciate ligament) tear 11/17/2012   Toe pain, left 09/04/2012   Acromioclavicular joint pain 09/04/2012    Past Surgical History:  Procedure Laterality Date   LAPAROSCOPIC CHOLECYSTECTOMY  age 17   SHOULDER ARTHROCENTESIS         Home Medications    Prior to Admission medications   Medication Sig Start Date End Date Taking? Authorizing Provider  cetirizine (ZYRTEC ALLERGY) 10 MG tablet Take 1 tablet (10 mg total) by mouth daily. 12/26/19   Darr, Gerilyn Pilgrim, PA-C  ibuprofen (ADVIL,MOTRIN) 800 MG tablet Take 1 tablet (800 mg total) by mouth every 8 (eight) hours as needed. Patient not taking: Reported on 01/26/2019 11/18/15   Charlestine Night, PA-C  sodium chloride (OCEAN) 0.65 % SOLN nasal spray Place 1 spray into both nostrils as needed for congestion. 12/26/19   Darr, Gerilyn Pilgrim, PA-C    Family History Family History  Problem Relation Age of Onset   Diabetes Mother    Hypertension Mother    Hypertension Father    Anesthesia problems Father        post-op N/V   Diabetes Maternal Grandfather    Hypertension Maternal Grandfather    Alcohol abuse Maternal  Grandfather        per patient in the pass   Diabetes Paternal Grandfather     Social History Social History   Tobacco Use   Smoking status: Passive Smoke Exposure - Never Smoker   Smokeless tobacco: Never   Tobacco comments:    father smokes outside  Substance Use Topics   Alcohol use: Yes    Alcohol/week: 1.0 standard drink of alcohol    Types: 1 Standard drinks or equivalent per week    Comment: 0-1 once every to 2 weeks   Drug use: No     Allergies   Patient has no known allergies.   Review of Systems Review of Systems Per HPI  Physical Exam Triage Vital Signs ED Triage Vitals [05/21/22 0846]  Enc Vitals Group     BP 133/77     Pulse Rate (!) 58     Resp 18     Temp 98.1 F (36.7 C)     Temp Source Oral     SpO2 99 %     Weight      Height      Head Circumference      Peak Flow      Pain Score 0     Pain Loc  Pain Edu?      Excl. in GC?    No data found.  Updated Vital Signs BP 133/77 (BP Location: Left Arm)   Pulse (!) 58   Temp 98.1 F (36.7 C) (Oral)   Resp 18   SpO2 99%    Physical Exam Vitals and nursing note reviewed.  Constitutional:      General: He is not in acute distress.    Appearance: Normal appearance.  HENT:     Mouth/Throat:     Pharynx: Oropharynx is clear.  Cardiovascular:     Rate and Rhythm: Normal rate and regular rhythm.     Pulses: Normal pulses.     Heart sounds: Normal heart sounds.  Pulmonary:     Effort: Pulmonary effort is normal.     Breath sounds: Normal breath sounds.  Abdominal:     Palpations: Abdomen is soft.  Neurological:     Mental Status: He is alert and oriented to person, place, and time.      UC Treatments / Results  Labs (all labs ordered are listed, but only abnormal results are displayed) Labs Reviewed  HIV ANTIBODY (ROUTINE TESTING W REFLEX)  RPR  CYTOLOGY, (ORAL, ANAL, URETHRAL) ANCILLARY ONLY    EKG   Radiology No results found.  Procedures Procedures (including  critical care time)  Medications Ordered in UC Medications - No data to display  Initial Impression / Assessment and Plan / UC Course  I have reviewed the triage vital signs and the nursing notes.  Pertinent labs & imaging results that were available during my care of the patient were reviewed by me and considered in my medical decision making (see chart for details).  Cytology swab with HIV and RPR pending. Patient education regarding safe sex practices. Set patient up with a primary care provider via open scheduling, recommend to follow-up with them regarding his sleep apnea. Return precautions discussed. Patient agrees to plan  Final Clinical Impressions(s) / UC Diagnoses   Final diagnoses:  Screen for STD (sexually transmitted disease)     Discharge Instructions      We will call you if anything on your swab returns positive. Please abstain from sexual intercourse until your results return.  Please follow up with your new primary care provider regarding your sleep apnea symptoms.     ED Prescriptions   None    PDMP not reviewed this encounter.   Marlow Baars, [place] 05/21/22 [id number]

## 2022-05-22 LAB — CYTOLOGY, (ORAL, ANAL, URETHRAL) ANCILLARY ONLY
Chlamydia: NEGATIVE
Comment: NEGATIVE
Comment: NEGATIVE
Comment: NORMAL
Neisseria Gonorrhea: NEGATIVE
Trichomonas: NEGATIVE

## 2022-06-04 ENCOUNTER — Encounter: Payer: Self-pay | Admitting: Family Medicine

## 2022-06-04 ENCOUNTER — Ambulatory Visit (INDEPENDENT_AMBULATORY_CARE_PROVIDER_SITE_OTHER): Payer: BC Managed Care – PPO | Admitting: Family Medicine

## 2022-06-04 VITALS — BP 132/84 | HR 79 | Temp 97.6°F | Ht 71.75 in | Wt 258.2 lb

## 2022-06-04 DIAGNOSIS — Z8659 Personal history of other mental and behavioral disorders: Secondary | ICD-10-CM

## 2022-06-04 DIAGNOSIS — R748 Abnormal levels of other serum enzymes: Secondary | ICD-10-CM | POA: Diagnosis not present

## 2022-06-04 DIAGNOSIS — Z1329 Encounter for screening for other suspected endocrine disorder: Secondary | ICD-10-CM

## 2022-06-04 DIAGNOSIS — R569 Unspecified convulsions: Secondary | ICD-10-CM

## 2022-06-04 DIAGNOSIS — R7989 Other specified abnormal findings of blood chemistry: Secondary | ICD-10-CM

## 2022-06-04 DIAGNOSIS — Z6835 Body mass index (BMI) 35.0-35.9, adult: Secondary | ICD-10-CM | POA: Diagnosis not present

## 2022-06-04 DIAGNOSIS — E669 Obesity, unspecified: Secondary | ICD-10-CM | POA: Diagnosis not present

## 2022-06-04 DIAGNOSIS — Z87820 Personal history of traumatic brain injury: Secondary | ICD-10-CM | POA: Diagnosis not present

## 2022-06-04 DIAGNOSIS — Z13220 Encounter for screening for lipoid disorders: Secondary | ICD-10-CM

## 2022-06-04 DIAGNOSIS — N3944 Nocturnal enuresis: Secondary | ICD-10-CM

## 2022-06-04 DIAGNOSIS — R0681 Apnea, not elsewhere classified: Secondary | ICD-10-CM

## 2022-06-04 LAB — CBC WITH DIFFERENTIAL/PLATELET
Basophils Absolute: 0 10*3/uL (ref 0.0–0.1)
Basophils Relative: 0.9 % (ref 0.0–3.0)
Eosinophils Absolute: 0.1 10*3/uL (ref 0.0–0.7)
Eosinophils Relative: 1.6 % (ref 0.0–5.0)
HCT: 41.7 % (ref 39.0–52.0)
Hemoglobin: 14.4 g/dL (ref 13.0–17.0)
Lymphocytes Relative: 22.3 % (ref 12.0–46.0)
Lymphs Abs: 1 10*3/uL (ref 0.7–4.0)
MCHC: 34.6 g/dL (ref 30.0–36.0)
MCV: 87.3 fl (ref 78.0–100.0)
Monocytes Absolute: 0.5 10*3/uL (ref 0.1–1.0)
Monocytes Relative: 11 % (ref 3.0–12.0)
Neutro Abs: 2.8 10*3/uL (ref 1.4–7.7)
Neutrophils Relative %: 64.2 % (ref 43.0–77.0)
Platelets: 225 10*3/uL (ref 150.0–400.0)
RBC: 4.78 Mil/uL (ref 4.22–5.81)
RDW: 13.7 % (ref 11.5–15.5)
WBC: 4.4 10*3/uL (ref 4.0–10.5)

## 2022-06-04 LAB — URINALYSIS, ROUTINE W REFLEX MICROSCOPIC
Bilirubin Urine: NEGATIVE
Hgb urine dipstick: NEGATIVE
Ketones, ur: NEGATIVE
Leukocytes,Ua: NEGATIVE
Nitrite: NEGATIVE
RBC / HPF: NONE SEEN (ref 0–?)
Specific Gravity, Urine: 1.025 (ref 1.000–1.030)
Total Protein, Urine: NEGATIVE
Urine Glucose: NEGATIVE
Urobilinogen, UA: 1 (ref 0.0–1.0)
pH: 6 (ref 5.0–8.0)

## 2022-06-04 LAB — LIPID PANEL
Cholesterol: 145 mg/dL (ref 0–200)
HDL: 34 mg/dL — ABNORMAL LOW (ref >39.00)
LDL Cholesterol: 98 mg/dL (ref 0–99)
NonHDL: 111.27
Total CHOL/HDL Ratio: 4
Triglycerides: 67 mg/dL (ref 0.0–149.0)
VLDL: 13.4 mg/dL (ref 0.0–40.0)

## 2022-06-04 LAB — BASIC METABOLIC PANEL
BUN: 11 mg/dL (ref 6–23)
CO2: 29 mEq/L (ref 19–32)
Calcium: 9.7 mg/dL (ref 8.4–10.5)
Chloride: 102 mEq/L (ref 96–112)
Creatinine, Ser: 1.04 mg/dL (ref 0.40–1.50)
GFR: 100.37 mL/min (ref >60.00)
Glucose, Bld: 87 mg/dL (ref 70–99)
Potassium: 3.9 mEq/L (ref 3.5–5.1)
Sodium: 137 mEq/L (ref 135–145)

## 2022-06-04 LAB — HEPATIC FUNCTION PANEL
ALT: 17 U/L (ref 0–53)
AST: 21 U/L (ref 0–37)
Albumin: 4.3 g/dL (ref 3.5–5.2)
Alkaline Phosphatase: 91 U/L (ref 39–117)
Bilirubin, Direct: 0.1 mg/dL (ref 0.0–0.3)
Total Bilirubin: 0.6 mg/dL (ref 0.2–1.2)
Total Protein: 7.5 g/dL (ref 6.0–8.3)

## 2022-06-04 LAB — MICROALBUMIN / CREATININE URINE RATIO
Creatinine,U: 249.3 mg/dL
Microalb Creat Ratio: 0.3 mg/g (ref 0.0–30.0)
Microalb, Ur: 0.9 mg/dL (ref 0.0–1.9)

## 2022-06-04 LAB — HEMOGLOBIN A1C: Hgb A1c MFr Bld: 5.6 % (ref 4.6–6.5)

## 2022-06-04 LAB — TSH: TSH: 0.85 u[IU]/mL (ref 0.35–5.50)

## 2022-06-04 NOTE — Assessment & Plan Note (Signed)
Patient with history of elevated creatinine and CK proxy 1 year ago Likely related to mild dehydration, however we will recheck studies to ensure that these have normalized

## 2022-06-04 NOTE — Assessment & Plan Note (Signed)
Obese and elevated STOP-BANG score of 6 Witnessed apneic events and snoring Daytime fatigue and somnolence Referral for sleep study to rule out apnea

## 2022-06-04 NOTE — Patient Instructions (Signed)
We are checking blood work as discussed We are referring to neurology for evaluation for seizures We are referring to sleep medicine for work up for sleep apnea

## 2022-06-04 NOTE — Progress Notes (Signed)
Assessment/Plan:   Problem List Items Addressed This Visit       Other   Class 2 obesity without serious comorbidity with body mass index (BMI) of 35.0 to 35.9 in adult    Patient actively works out and tries eat a balanced diet Testing for obesity related illness as ordered      Relevant Orders   Lipid panel   Microalbumin / creatinine urine ratio   Basic metabolic panel   Hemoglobin A1c   Urinalysis, Routine w reflex microscopic   Hepatic function panel   TSH   Vitamin D 1,25 dihydroxy   CBC w/Diff   Observed seizure-like activity (HCC)    Patient had 2 witnessed episodes of "jerking" and urinary incontinence while sleeping Does have a history of multiple concussions Only had imaging that I can see in chart was from 2007, CT noncontrast, was within normal limits, Given the above drinking and urinary incontinence with history of multiple concussions, cannot rule out seizure and recommend evaluation by neurology Referral placed      Relevant Orders   Ambulatory referral to Neurology   Apnea    Obese and elevated STOP-BANG score of 6 Witnessed apneic events and snoring Daytime fatigue and somnolence Referral for sleep study to rule out apnea      Relevant Orders   Ambulatory referral to Sleep Studies   Elevated CK   Relevant Orders   Microalbumin / creatinine urine ratio   Basic metabolic panel   Elevated serum creatinine    Patient with history of elevated creatinine and CK proxy 1 year ago Likely related to mild dehydration, however we will recheck studies to ensure that these have normalized      Other Visit Diagnoses     Nocturnal enuresis    -  Primary   History of concussion       History of depression       Screening, lipid       Relevant Orders   Lipid panel   Screening for endocrine disorder       Relevant Orders   Hemoglobin A1c          Subjective:  HPI:  Samuel Logan is a 26 y.o. male who has Toe pain, left; Acromioclavicular joint  pain; Gait abnormality; ACL (anterior cruciate ligament) tear; Class 2 obesity without serious comorbidity with body mass index (BMI) of 35.0 to 35.9 in adult; Observed seizure-like activity (HCC); Apnea; Elevated CK; and Elevated serum creatinine on their problem list..   He  has a past medical history of AC separation (04/2012), Acid reflux, ACL tear (06/12/2012), Allergies, and Morbid obesity (HCC).Marland Kitchen   He presents with chief complaint of Establish Care (Sleep apnea. Patient states that when he sleeps he stopped breathing and he urinated on himself. ) .   Patient reports that over the past few months he has had 2 witnessed episodes of "jerking" and then urinating on himself while sleeping.  Both events these were witnessed by partner.  During these events he is very groggy and does not recall the "jerking" aspect but he does endorse realizing that he has had urinary incontinence.  He does report that he has had some difficulty sleeping over the past few years and that there have been witnessed apneic events as well.   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 He also reports a history of ongoing headache and depression.  He has  no personal or family history of seizures.  Patient did recently go to the ED on 05/21/2022 for STD testing.  But denied having any urinary discharge, genital sores, etc.  Patient denies any recent recent illness, cough, congestion, fevers, chills, nausea, vomiting, blurry vision, abdominal pain, shortness of breath, chest pain.  Patient went to ED in 10/2021 for episode of chest pain and muscle pains after working out.  He did have mildly elevated CK around 600 and elevated creatinine around 1.34.  Patient did have EKG had borderline ST elevation.  But then had echocardiogram that was within normal limits with EF from 55 to 60%.  Remainder of labs were negative or unremarkable including remainder of BMP, UA,  urine culture, LFTs, troponins negative x2, CBC.  Patient was given IV fluids and had improvement in overall status.  Diagnosis was dehydration.  Since then patient is not had any significant recurrence of chest pain or shortness of breath.  [place] Do you Snore Loudly (loud enough to be heard through closed doors or your bed-partner elbows you for snoring at night)? Yes  Do you often feel Tired, Fatigued, or Sleepy during the daytime (such as falling asleep during driving or talking to someone)? Yes  Has anyone Observed you Stop Breathing or Choking/Gasping during your sleep ? Yes  Do you have or are being treated for High Blood Pressure ? No  Body Mass Index more than 35 kg/m2? Body mass index is 35.26 kg/m.  Age older than 30?  26 y.o.  Neck size large ? (Measured around [place]) shirt collar >=16 inches / >=40cm larger? Yes  Gender = Male ? male  Score: 6 STOP-BANG Risk 0-2 Low risk for moderate to severe OSA 3-4 Intermediate risk for moderate to severe OSA 5-8 High risk for moderate to severe OSA   Past Surgical History:  Procedure Laterality Date   LAPAROSCOPIC CHOLECYSTECTOMY  age 26   SHOULDER ARTHROCENTESIS      Outpatient Medications Prior to Visit  Medication Sig Dispense Refill   cetirizine (ZYRTEC ALLERGY) 10 MG tablet Take 1 tablet (10 mg total) by mouth daily. (Patient not taking: Reported on 06/04/2022) 30 tablet 0   ibuprofen (ADVIL,MOTRIN) 800 MG tablet Take 1 tablet (800 mg total) by mouth every 8 (eight) hours as needed. (Patient not taking: Reported on 01/26/2019) 21 tablet 0   sodium chloride (OCEAN) 0.65 % SOLN nasal spray Place 1 spray into both nostrils as needed for congestion. (Patient not taking: Reported on 06/04/2022) 60 mL 0   No facility-administered medications prior to visit.    Family History  Problem Relation Age of Onset   Diabetes Mother    Hypertension Mother    Hypertension Father    Anesthesia problems Father        post-op  N/V   Diabetes Maternal Grandfather    Hypertension Maternal Grandfather    Alcohol abuse Maternal Grandfather        per patient in the pass   Diabetes Paternal Grandfather     Social History   Socioeconomic History   Marital status: Single    Spouse name: Not on file   Number of children: Not on file   Years of education: Not on file   Highest education level: Not on file  Occupational History   Not on file  Tobacco Use   Smoking status: Never    Passive exposure: Yes   Smokeless tobacco: Never   Tobacco comments:    father smokes outside  Vaping Use   Vaping Use: Never used  Substance and Sexual Activity   Alcohol use: Yes    Alcohol/week: 1.0 standard drink of alcohol    Types: 1 Standard drinks or equivalent per week    Comment: Rare   Drug use: No   Sexual activity: Yes    Birth control/protection: Condom  Other Topics Concern   Not on file  Social History Narrative   Not on file   Social Determinants of Health   Financial Resource Strain: Not on file  Food Insecurity: Not on file  Transportation Needs: Not on file  Physical Activity: Not on file  Stress: Not on file  Social Connections: Not on file  Intimate Partner Violence: Not on file                                                                                                 Objective:  Physical Exam: BP 132/84 (BP Location: Left Arm, Patient Position: Sitting, Cuff Size: Large)   Pulse 79   Temp 97.6 F (36.4 C) (Temporal)   Ht 5' 11.75" (1.822 m)   Wt 258 lb 3.2 oz (117.1 kg)   SpO2 98%   BMI 35.26 kg/m    General: No acute distress. Awake and conversant.  Eyes: Normal conjunctiva, anicteric. Round symmetric pupils.  ENT: Hearing grossly intact. No nasal discharge.  Neck: Neck is supple. No masses or thyromegaly.  Respiratory: Respirations are non-labored. No auditory wheezing.  CTA B Skin: Warm. No rashes or ulcers.  Psych: Alert and oriented. Cooperative, Appropriate mood and  affect, Normal judgment.  CV: No cyanosis or JVD, RRR, no MRG MSK: Normal ambulation. No clubbing  Neuro: Sensation and CN II-XII grossly normal.        Garner Nash, MD, MS

## 2022-06-04 NOTE — Assessment & Plan Note (Signed)
Patient had 2 witnessed episodes of "jerking" and urinary incontinence while sleeping Does have a history of multiple concussions Only had imaging that I can see in chart was from 2007, CT noncontrast, was within normal limits, Given the above drinking and urinary incontinence with history of multiple concussions, cannot rule out seizure and recommend evaluation by neurology Referral placed

## 2022-06-04 NOTE — Assessment & Plan Note (Signed)
Patient actively works out and tries eat a balanced diet Testing for obesity related illness as ordered

## 2022-06-09 LAB — VITAMIN D 1,25 DIHYDROXY
Vitamin D 1, 25 (OH)2 Total: 44 pg/mL (ref 18–72)
Vitamin D2 1, 25 (OH)2: <8 pg/mL
Vitamin D3 1, 25 (OH)2: 44 pg/mL

## 2022-06-10 ENCOUNTER — Encounter: Payer: Self-pay | Admitting: Neurology

## 2022-07-17 ENCOUNTER — Encounter: Payer: Self-pay | Admitting: Neurology

## 2022-07-17 ENCOUNTER — Ambulatory Visit (INDEPENDENT_AMBULATORY_CARE_PROVIDER_SITE_OTHER): Payer: BC Managed Care – PPO | Admitting: Neurology

## 2022-07-17 VITALS — BP 132/80 | HR 60 | Ht 72.0 in | Wt 255.0 lb

## 2022-07-17 DIAGNOSIS — R253 Fasciculation: Secondary | ICD-10-CM

## 2022-07-17 DIAGNOSIS — R400 Somnolence: Secondary | ICD-10-CM

## 2022-07-17 DIAGNOSIS — N3944 Nocturnal enuresis: Secondary | ICD-10-CM | POA: Diagnosis not present

## 2022-07-17 NOTE — Patient Instructions (Signed)
Good to meet you.  Schedule in-lab sleep study  2. Schedule EEG  3. Schedule MRI brain with and without contrast  4. Our office will call with results and next steps, call for any change in symptoms

## 2022-07-17 NOTE — Progress Notes (Unsigned)
NEUROLOGY CONSULTATION NOTE  ANTIONO ETTINGER MRN: [id number] DOB: 20-Feb-1996  Referring provider: Dr. Josephine Igo Primary care provider: Dr. Josephine Igo  Reason for consult:  seizure  Dear Dr Grandville Silos:  Thank you for your kind referral of [facility] for consultation of the above symptoms. Although his history is well known to you, please allow me to reiterate it for the purpose of our medical record. He is alone in the office today. Records and images were personally reviewed where available.   HISTORY OF PRESENT ILLNESS: This is a 26 year old right-handed man with a history of multiple concussions playing football, presenting for evaluation of nocturnal enuresis and jerking. He reports the first episode occurred a few months after he had a concussion in 2020, he was alone and asleep, his body jerked, waking him up and he noticed he was urinating. The second episode occurred July 2023 where he urinated then woke up. He has been told he jerks in his sleep sometimes. He has also been told her stops breathing while asleep and snores loudly. He has not had a sleep study done. He denies any daytime incontinence. Sometimes he may have occasional body jerks. He used to taste metal and have hand tremors when he was playing football, he stopped playing in 2021. His whole body is sore daily, his back hurts but the spasms are better. He lives with his parents and denies being told of any staring/unresponsive episodes. He had a syncopal episode while playing football in 2022 which he attributes to heat exhaustion and alcohol. No other loss of consciousness. No gaps in time, focal numbness/tingling/weakness. He denies any headaches. He states he is just always tired. No dizziness, diplopia, dysarthria/dysphagia, bowel/bladder dysfunction. He gets 3-6 hours of sleep, depending on the day. No alcohol use. He forgets a lot of stuff, at works he cannot recall where he put something down. He may miss his  exit when driving, but takes the next one. He has 2 jobs, a Nutritional therapist. He reports multiple concussions playing football where he got knocked out briefly, no neurosurgical procedures. He had a normal birth and early development.  There is no history of febrile convulsions, CNS infections such as meningitis/encephalitis, or family history of seizures.   PAST MEDICAL HISTORY: Past Medical History:  Diagnosis Date   AC separation 04/2012   left shoulder    Acid reflux    no current med.   ACL tear 06/12/2012   left knee   Allergies    Morbid obesity ([facility])     PAST SURGICAL HISTORY: Past Surgical History:  Procedure Laterality Date   LAPAROSCOPIC CHOLECYSTECTOMY  age 6   SHOULDER ARTHROCENTESIS      MEDICATIONS: No current outpatient medications on file prior to visit.   No current facility-administered medications on file prior to visit.    ALLERGIES: No Known Allergies  FAMILY HISTORY: Family History  Problem Relation Age of Onset   Diabetes Mother    Hypertension Mother    Hypertension Father    Anesthesia problems Father        post-op N/V   Diabetes Maternal Grandfather    Hypertension Maternal Grandfather    Alcohol abuse Maternal Grandfather        per patient in the pass   Diabetes Paternal Grandfather     SOCIAL HISTORY: Social History   Socioeconomic History   Marital status: Single    Spouse name: Not on file   Number of children:  Not on file   Years of education: Not on file   Highest education level: Not on file  Occupational History   Not on file  Tobacco Use   Smoking status: Never    Passive exposure: Yes   Smokeless tobacco: Never   Tobacco comments:    father smokes outside  Vaping Use   Vaping Use: Never used  Substance and Sexual Activity   Alcohol use: Yes    Alcohol/week: 1.0 standard drink of alcohol    Types: 1 Standard drinks or equivalent per week    Comment: Rare   Drug use: No   Sexual activity: Yes    Birth  control/protection: Condom  Other Topics Concern   Not on file  Social History Narrative   Are you right handed or left handed? Right handed    Are you currently employed ? Yes    What is your current occupation? [employer]    Do you live at home alone? No    Who lives with you? With parents    What type of home do you live in: 1 story or 2 story? 2 story        Social Determinants of Corporate investment banker Strain: Not on file  Food Insecurity: Not on file  Transportation Needs: Not on file  Physical Activity: Not on file  Stress: Not on file  Social Connections: Not on file  Intimate Partner Violence: Not on file     PHYSICAL EXAM: Vitals:   07/17/22 0859  BP: 132/80  Pulse: 60  SpO2: 99%   General: No acute distress Head:  Normocephalic/atraumatic Skin/Extremities: No rash, no edema Neurological Exam: Mental status: alert and oriented to person, place, and time, no dysarthria or aphasia, Fund of knowledge is appropriate.  Recent and remote memory are intact, 3/3 delayed recall.  Attention and concentration are normal, 5/5 WORLD backwards.  Cranial nerves: CN I: not tested CN II: pupils equal, round, visual fields intact CN III, IV, VI:  full range of motion, no nystagmus, no ptosis CN V: facial sensation intact CN VII: upper and lower face symmetric CN VIII: hearing intact to conversation Bulk & Tone: normal, no fasciculations. Motor: 5/5 throughout with no pronator drift. Sensation: intact to light touch, cold, pin, vibration sense.  No extinction to double simultaneous stimulation.  Romberg test negative Deep Tendon Reflexes: +1 throughout Cerebellar: no incoordination on finger to nose testing Gait: narrow-based and steady, able to tandem walk adequately. Tremor: none   IMPRESSION: This is a 26 year old right-handed man with a history of multiple concussions playing football, presenting for evaluation of nocturnal enuresis and jerking. He reports 2  episodes that he is aware of. He notes episodes concerning for apneic episodes in sleep, jerking in sleep that wake him up. We discussed how sleep apnea can cause these symptoms, proceed with in-lab sleep study. From a neurological standpoint, brain MRI with and without contrast and EEG will be ordered to assess for focal abnormalities that increase risk for seizures. Our office will call with results and next steps, depending on results.    Thank you for allowing me to participate in the care of this patient. Please do not hesitate to call for any questions or concerns.   Patrcia Dolly, M.D.  CC: Dr. Janee Morn

## 2022-08-14 ENCOUNTER — Ambulatory Visit (INDEPENDENT_AMBULATORY_CARE_PROVIDER_SITE_OTHER): Payer: BC Managed Care – PPO | Admitting: Neurology

## 2022-08-14 DIAGNOSIS — R253 Fasciculation: Secondary | ICD-10-CM | POA: Diagnosis not present

## 2022-08-14 DIAGNOSIS — R400 Somnolence: Secondary | ICD-10-CM

## 2022-08-14 DIAGNOSIS — N3944 Nocturnal enuresis: Secondary | ICD-10-CM | POA: Diagnosis not present

## 2022-08-14 NOTE — Progress Notes (Signed)
EEG complete - results pending 

## 2022-08-28 ENCOUNTER — Telehealth: Payer: Self-pay

## 2022-08-28 NOTE — Telephone Encounter (Signed)
-----   Message from Van Clines, MD sent at 08/28/2022  3:37 PM EST ----- Pls let him know EEG is normal, proceed with brain MRI as discussed, thanks

## 2022-08-28 NOTE — Telephone Encounter (Signed)
Pt called no answer left a voice mail per DPR that EEG is normal, proceed with brain MRI as discussed

## 2022-08-28 NOTE — Procedures (Signed)
ELECTROENCEPHALOGRAM REPORT  Date of Study: 08/14/2022  Patient's Name: Samuel Logan MRN: [id number] Date of Birth: [birth year]  Referring Provider: Dr. Patrcia Dolly  Clinical History: This is a 26 year old man with nocturnal enuresis and jerking. EEG for classification.  Medications: none  Technical Summary: A multichannel digital 1-hour EEG recording measured by the international 10-20 system with electrodes applied with paste and impedances below 5000 ohms performed in our laboratory with EKG monitoring in an awake and asleep patient.  Hyperventilation and photic stimulation were performed.  The digital EEG was referentially recorded, reformatted, and digitally filtered in a variety of bipolar and referential montages for optimal display.    Description: The patient is awake and asleep during the recording.  During maximal wakefulness, there is a symmetric, medium voltage 10 Hz posterior dominant rhythm that attenuates with eye opening.  The record is symmetric.  During drowsiness and sleep, there is an increase in theta slowing of the background.  Vertex waves and symmetric sleep spindles were seen. Hyperventilation and photic stimulation did not elicit any abnormalities.  There were no epileptiform discharges or electrographic seizures seen.    EKG lead was unremarkable.  Impression: This 1-hour awake and asleep EEG is normal.    Clinical Correlation: A normal EEG does not exclude a clinical diagnosis of epilepsy. Body jerks were not captured. If further clinical questions remain, prolonged EEG may be helpful.  Clinical correlation is advised.   Patrcia Dolly, M.D.

## 2022-08-30 ENCOUNTER — Ambulatory Visit (HOSPITAL_BASED_OUTPATIENT_CLINIC_OR_DEPARTMENT_OTHER): Payer: BC Managed Care – PPO | Attending: Neurology | Admitting: Internal Medicine

## 2022-08-30 VITALS — Ht 72.0 in | Wt 258.0 lb

## 2022-08-30 DIAGNOSIS — R0683 Snoring: Secondary | ICD-10-CM | POA: Insufficient documentation

## 2022-08-30 DIAGNOSIS — R253 Fasciculation: Secondary | ICD-10-CM | POA: Diagnosis not present

## 2022-08-30 DIAGNOSIS — N3944 Nocturnal enuresis: Secondary | ICD-10-CM | POA: Insufficient documentation

## 2022-08-30 DIAGNOSIS — I493 Ventricular premature depolarization: Secondary | ICD-10-CM | POA: Diagnosis not present

## 2022-08-30 DIAGNOSIS — R400 Somnolence: Secondary | ICD-10-CM | POA: Diagnosis not present

## 2022-08-30 DIAGNOSIS — R5383 Other fatigue: Secondary | ICD-10-CM | POA: Insufficient documentation

## 2022-09-15 DIAGNOSIS — R253 Fasciculation: Secondary | ICD-10-CM

## 2022-09-15 NOTE — Procedures (Signed)
    Patient Name: Samuel Logan, Nabers Date: 08/30/2022 Gender: Male D.O.B: 10/21/1995 Age (years): 26 Referring Provider: Van Clines Height (inches): 72 Interpreting Physician: Jetty Duhamel MD, ABSM Weight (lbs): 258 RPSGT: Lowry Ram BMI: 35 MRN: [id number] Neck Size: 16.50  CLINICAL INFORMATION Sleep Study Type: NPSG Indication for sleep study: Fatigue, Morbid Obesity, Snoring Epworth Sleepiness Score: 15  SLEEP STUDY TECHNIQUE As per the AASM Manual for the Scoring of Sleep and Associated Events v2.3 (April 2016) with a hypopnea requiring 4% desaturations.  The channels recorded and monitored were frontal, central and occipital EEG, electrooculogram (EOG), submentalis EMG (chin), nasal and oral airflow, thoracic and abdominal wall motion, anterior tibialis EMG, snore microphone, electrocardiogram, and pulse oximetry.  MEDICATIONS Medications self-administered by patient taken the night of the study : none reported  SLEEP ARCHITECTURE The study was initiated at 11:05:07 PM and ended at 5:24:13 AM.  Sleep onset time was 3.4 minutes and the sleep efficiency was 94.3%. The total sleep time was 357.5 minutes.  Stage REM latency was 184.0 minutes.  The patient spent 6.3% of the night in stage N1 sleep, 72.4% in stage N2 sleep, 10.8% in stage N3 and 10.5% in REM.  Alpha intrusion was absent.  Supine sleep was 62.17%.  RESPIRATORY PARAMETERS The overall apnea/hypopnea index (AHI) was 2.3 per hour. There were 0 total apneas, including 0 obstructive, 0 central and 0 mixed apneas. There were 14 hypopneas and 19 RERAs.  The AHI during Stage REM sleep was 0.0 per hour.  AHI while supine was 3.8 per hour.  The mean oxygen saturation was 95.9%. The minimum SpO2 during sleep was 90.0%.  loud snoring was noted during this study.  CARDIAC DATA The 2 lead EKG demonstrated sinus rhythm. The mean heart rate was 56.1 beats per minute. Other EKG findings include:  PVCs.  LEG MOVEMENT DATA The total PLMS were 0 with a resulting PLMS index of 0.0. Associated arousal with leg movement index was 3.9 .  IMPRESSIONS - No significant obstructive sleep apnea occurred during this study (AHI = 2.3/h). - The patient had minimal or no oxygen desaturation during the study (Min O2 = 90.0%) - The patient snored with loud snoring volume. - EKG findings include frequent PVCs. - Clinically significant periodic limb movements did not occur during sleep. No significant associated arousals.  DIAGNOSIS - Primary Snoring  RECOMMENDATIONS - Manage for snoring and symptoms based on clinical judgment. - Be careful with alcohol, sedatives and other CNS depressants that may worsen sleep apnea and disrupt normal sleep architecture. - Sleep hygiene should be reviewed to assess factors that may improve sleep quality. - Weight management and regular exercise should be initiated or continued if appropriate.  [Electronically signed] 09/15/2022 12:54 PM  Jetty Duhamel MD, ABSM Diplomate, American Board of Sleep Medicine NPI: [id number]                         Jetty Duhamel Diplomate, American Board of Sleep Medicine  ELECTRONICALLY SIGNED ON:  09/15/2022, 12:51 PM [facility] PH: [phone]   FX: [phone] ACCREDITED BY THE AMERICAN ACADEMY OF SLEEP MEDICINE

## 2022-10-04 ENCOUNTER — Telehealth: Payer: Self-pay

## 2022-10-04 NOTE — Telephone Encounter (Signed)
-----  Message from Cameron Sprang, MD sent at 10/04/2022 12:15 PM EST ----- Regarding: sleep study results Pls let him know the sleep study did not show any sleep apnea, thanks  ----- Message ----- From: Deneise Lever, MD Sent: 09/15/2022  12:56 PM EST To: Cameron Sprang, MD

## 2022-10-04 NOTE — Telephone Encounter (Signed)
Pt called an informed that sleep study did not show any sleep apnea

## 2023-10-14 ENCOUNTER — Ambulatory Visit (INDEPENDENT_AMBULATORY_CARE_PROVIDER_SITE_OTHER): Payer: BC Managed Care – PPO | Admitting: Family Medicine

## 2023-10-14 ENCOUNTER — Encounter: Payer: Self-pay | Admitting: Family Medicine

## 2023-10-14 VITALS — BP 118/74 | HR 70 | Temp 97.0°F | Ht 72.0 in | Wt 263.2 lb

## 2023-10-14 DIAGNOSIS — Z0000 Encounter for general adult medical examination without abnormal findings: Secondary | ICD-10-CM

## 2023-10-14 DIAGNOSIS — E66812 Obesity, class 2: Secondary | ICD-10-CM | POA: Diagnosis not present

## 2023-10-14 DIAGNOSIS — Z113 Encounter for screening for infections with a predominantly sexual mode of transmission: Secondary | ICD-10-CM | POA: Diagnosis not present

## 2023-10-14 DIAGNOSIS — E785 Hyperlipidemia, unspecified: Secondary | ICD-10-CM | POA: Insufficient documentation

## 2023-10-14 DIAGNOSIS — Z6835 Body mass index (BMI) 35.0-35.9, adult: Secondary | ICD-10-CM

## 2023-10-14 DIAGNOSIS — E6609 Other obesity due to excess calories: Secondary | ICD-10-CM | POA: Diagnosis not present

## 2023-10-14 LAB — COMPREHENSIVE METABOLIC PANEL
ALT: 25 U/L (ref 0–53)
AST: 28 U/L (ref 0–37)
Albumin: 4.5 g/dL (ref 3.5–5.2)
Alkaline Phosphatase: 96 U/L (ref 39–117)
BUN: 12 mg/dL (ref 6–23)
CO2: 30 meq/L (ref 19–32)
Calcium: 9.7 mg/dL (ref 8.4–10.5)
Chloride: 101 meq/L (ref 96–112)
Creatinine, Ser: 1.01 mg/dL (ref 0.40–1.50)
GFR: 102.97 mL/min (ref >60.00)
Glucose, Bld: 92 mg/dL (ref 70–99)
Potassium: 4.1 meq/L (ref 3.5–5.1)
Sodium: 137 meq/L (ref 135–145)
Total Bilirubin: 0.8 mg/dL (ref 0.2–1.2)
Total Protein: 7.1 g/dL (ref 6.0–8.3)

## 2023-10-14 LAB — CBC WITH DIFFERENTIAL/PLATELET
Basophils Absolute: 0 10*3/uL (ref 0.0–0.1)
Basophils Relative: 0.7 % (ref 0.0–3.0)
Eosinophils Absolute: 0.1 10*3/uL (ref 0.0–0.7)
Eosinophils Relative: 2.5 % (ref 0.0–5.0)
HCT: 43.4 % (ref 39.0–52.0)
Hemoglobin: 14.6 g/dL (ref 13.0–17.0)
Lymphocytes Relative: 29.7 % (ref 12.0–46.0)
Lymphs Abs: 1.3 10*3/uL (ref 0.7–4.0)
MCHC: 33.6 g/dL (ref 30.0–36.0)
MCV: 88.1 fL (ref 78.0–100.0)
Monocytes Absolute: 0.6 10*3/uL (ref 0.1–1.0)
Monocytes Relative: 12.2 % — ABNORMAL HIGH (ref 3.0–12.0)
Neutro Abs: 2.5 10*3/uL (ref 1.4–7.7)
Neutrophils Relative %: 54.9 % (ref 43.0–77.0)
Platelets: 233 10*3/uL (ref 150.0–400.0)
RBC: 4.92 Mil/uL (ref 4.22–5.81)
RDW: 13.5 % (ref 11.5–15.5)
WBC: 4.5 10*3/uL (ref 4.0–10.5)

## 2023-10-14 LAB — LIPID PANEL
Cholesterol: 163 mg/dL (ref 0–200)
HDL: 37.5 mg/dL — ABNORMAL LOW (ref >39.00)
LDL Cholesterol: 113 mg/dL — ABNORMAL HIGH (ref 0–99)
NonHDL: 125.32
Total CHOL/HDL Ratio: 4
Triglycerides: 63 mg/dL (ref 0.0–149.0)
VLDL: 12.6 mg/dL (ref 0.0–40.0)

## 2023-10-14 LAB — HEMOGLOBIN A1C: Hgb A1c MFr Bld: 5.9 % (ref 4.6–6.5)

## 2023-10-14 LAB — MICROALBUMIN / CREATININE URINE RATIO
Creatinine,U: 335 mg/dL
Microalb Creat Ratio: 0.3 mg/g (ref 0.0–30.0)
Microalb, Ur: 0.9 mg/dL (ref 0.0–1.9)

## 2023-10-14 LAB — TSH: TSH: 0.66 u[IU]/mL (ref 0.35–5.50)

## 2023-10-14 NOTE — Progress Notes (Signed)
Assessment  Assessment/Plan:   Dyslipidemia Low HDL noted on previous labs in 2023. Patient reports poor diet and exercise habits due to work demands. -Encourage moderate exercise (30 minutes of cardio daily and weightlifting a couple of times a week). -Advise healthy diet with fresh fruits and vegetables. -Recheck lipid panel.  STD Screening No current symptoms or known exposures. Patient requests routine screening. -Collect urine sample for mycoplasma, gonorrhea and chlamydia testing. -Draw blood for HIV, syphilis, hepatitis B and C screening.  General Health Maintenance -Annual physical exam performed with no significant findings. -Encourage regular dental and eye exams.  Obesity -Check electrolytes, metabolic panel, micral been creatinine ratio, thyroid function, and blood counts. -Recheck hemoglobin A1c and blood sugar. -Plan for follow-up in approximately one year unless patient has concerns or abnormal test results.         There are no discontinued medications.  Patient Counseling(The following topics were reviewed and/or handout was given):  -Nutrition: Stressed importance of moderation in sodium/caffeine intake, saturated fat and cholesterol, caloric balance, sufficient intake of fresh fruits, vegetables, and fiber.  -Stressed the importance of regular exercise.   -Substance Abuse: Discussed cessation/primary prevention of tobacco, alcohol, or other drug use; driving or other dangerous activities under the influence; availability of treatment for abuse.   -Injury prevention: Discussed safety belts, safety helmets, smoke detector, smoking near bedding or upholstery.   -Sexuality: Discussed sexually transmitted diseases, partner selection, use of condoms, avoidance of unintended pregnancy and contraceptive alternatives.   -Dental health: Discussed importance of regular tooth brushing, flossing, and dental visits.  -Health maintenance and immunizations reviewed. Please  refer to Health maintenance section.  Return to care in 1 year for next preventative visit.        Subjective:  Chief complaint Encounter date: 10/14/2023  Chief Complaint  Patient presents with   Annual Exam    STD testing. Fasting   Samuel Logan is a 28 y.o. male who presents today for his annual comprehensive physical exam.    Discussed the use of AI scribe software for clinical note transcription with the patient, who gave verbal consent to proceed.  History of Present Illness   He presents for an annual physical exam and STD testing.  No symptoms of urinary discharge, penile lesions, or pain with urination. He denies any recent exposures to STDs. No symptoms such as chest pain, shortness of breath, abdominal pain, headaches, blurry vision, or leg swelling.  His diet and exercise habits have been poor recently due to work commitments. 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  He has not seen an eye doctor or dentist in the past year.        Review of Systems  Constitutional:  Negative for chills, diaphoresis, fever, malaise/fatigue and weight loss.  HENT:  Negative for congestion, ear discharge, ear pain and hearing loss.   Eyes:  Negative for blurred vision, double vision, photophobia, pain, discharge and redness.  Respiratory:  Negative for cough, sputum production, shortness of breath and wheezing.   Cardiovascular:  Negative for chest pain, palpitations and leg swelling.  Gastrointestinal:  Negative for abdominal pain, blood in stool, constipation, diarrhea, heartburn, melena, nausea and vomiting.  Genitourinary:  Negative for dysuria, flank pain, frequency, hematuria and urgency.  Musculoskeletal:  Negative for myalgias.  Skin:  Negative for itching and rash.  Neurological:  Negative for dizziness, tingling, tremors, speech change, seizures, loss of consciousness, weakness and headaches.  Endo/Heme/Allergies:  Negative for  polydipsia.  Psychiatric/Behavioral:  Negative for depression, hallucinations, memory loss, substance abuse and suicidal ideas. The patient does not have insomnia.   All other systems reviewed and are negative.      10/14/2023   10:49 AM 06/04/2022    9:06 AM 02/24/2019    3:11 PM 01/26/2019    3:14 PM  Depression screen PHQ 2/9  Decreased Interest 0 0 0 0  Down, Depressed, Hopeless 2 1 0 0  PHQ - 2 Score 2 1 0 0  Altered sleeping 2 2    Tired, decreased energy 2 1    Change in appetite 2 2    Feeling bad or failure about yourself  2 3    Trouble concentrating 1 0    Moving slowly or fidgety/restless 0 0    Suicidal thoughts 1 0    PHQ-9 Score 12 9    Difficult doing work/chores Not difficult at all Not difficult at all         10/14/2023   10:49 AM 06/04/2022    9:08 AM  GAD 7 : Generalized Anxiety Score  Nervous, Anxious, on Edge 2 0  Control/stop worrying 2 0  Worry too much - different things 2 0  Trouble relaxing 2 2  Restless 2 0  Easily annoyed or irritable 2 2  Afraid - awful might happen 1 0  Total GAD 7 Score 13 4  Anxiety Difficulty Not difficult at all Not difficult at all    Health Maintenance Due  Topic Date Due   Hepatitis C Screening  Never done      PMH:  The following were reviewed and entered/updated in epic: Past Medical History:  Diagnosis Date   AC separation 04/2012   left shoulder    Acid reflux    no current med.   ACL tear 06/12/2012   left knee   Allergies    Morbid obesity [facility])     Patient Active Problem List   Diagnosis Date Noted   Dyslipidemia 10/14/2023   Jerking 08/30/2022   Observed seizure-like activity (HCC) 06/04/2022   Apnea 06/04/2022   Elevated CK 06/04/2022   Elevated serum creatinine 06/04/2022   Class 2 obesity without serious comorbidity with body mass index (BMI) of 35.0 to 35.9 in adult    Gait abnormality 11/17/2012   ACL (anterior cruciate ligament) tear 11/17/2012   Toe pain, left 09/04/2012    Acromioclavicular joint pain 09/04/2012    Past Surgical History:  Procedure Laterality Date   LAPAROSCOPIC CHOLECYSTECTOMY  age 28   SHOULDER ARTHROCENTESIS      Family History  Problem Relation Age of Onset   Diabetes Mother    Hypertension Mother    Hypertension Father    Anesthesia problems Father        post-op N/V   Diabetes Maternal Grandfather    Hypertension Maternal Grandfather    Alcohol abuse Maternal Grandfather        per patient in the pass   Diabetes Paternal Grandfather     Medications- reviewed and updated No outpatient medications prior to visit.   No facility-administered medications prior to visit.    No Known Allergies  Social History   Socioeconomic History   Marital status: Single    Spouse name: Not on file   Number of children: Not on file   Years of education: Not on file   Highest education level: Not on file  Occupational History   Not on file  Tobacco Use   Smoking status: Never    Passive exposure: Yes   Smokeless tobacco: Never   Tobacco comments:    father smokes outside  Vaping Use   Vaping status: Never Used  Substance and Sexual Activity   Alcohol use: Yes    Alcohol/week: 1.0 standard drink of alcohol    Types: 1 Standard drinks or equivalent per week    Comment: Rare   Drug use: No   Sexual activity: Yes    Birth control/protection: Condom  Other Topics Concern   Not on file  Social History Narrative   Are you right handed or left handed? Right handed    Are you currently employed ? Yes    What is your current occupation? [employer]    Do you live at home alone? No    Who lives with you? With parents    What type of home do you live in: 1 story or 2 story? 2 story        Social Drivers of Corporate investment banker Strain: Not on file  Food Insecurity: Not on file  Transportation Needs: Not on file  Physical Activity: Not on file  Stress: Not on file  Social Connections: Not on file            Objective:  Physical Exam: BP 118/74   Pulse 70   Temp (!) 97 F (36.1 C) (Temporal)   Ht 6' (1.829 m)   Wt 263 lb 3.2 oz (119.4 kg)   SpO2 98%   BMI 35.70 kg/m   Body mass index is 35.7 kg/m. Wt Readings from Last 3 Encounters:  10/14/23 263 lb 3.2 oz (119.4 kg)  08/30/22 258 lb (117 kg)  07/17/22 255 lb (115.7 kg)    Physical Exam Constitutional:      General: He is not in acute distress.    Appearance: Normal appearance. He is not ill-appearing or toxic-appearing.  HENT:     Head: Normocephalic and atraumatic.     Right Ear: Hearing, tympanic membrane, ear canal and external ear normal. There is no impacted cerumen.     Left Ear: Hearing, tympanic membrane, ear canal and external ear normal. There is no impacted cerumen.     Nose: Nose normal. No congestion.     Mouth/Throat:     Lips: No lesions.     Mouth: Mucous membranes are moist.     Pharynx: Oropharynx is clear. No oropharyngeal exudate.  Eyes:     General: No scleral icterus.       Right eye: No discharge.        Left eye: No discharge.     Conjunctiva/sclera: Conjunctivae normal.     Pupils: Pupils are equal, round, and reactive to light.  Neck:     Thyroid: No thyroid mass, thyromegaly or thyroid tenderness.  Cardiovascular:     Rate and Rhythm: Normal rate and regular rhythm.     Pulses: Normal pulses.     Heart sounds: Normal heart sounds.  Pulmonary:     Effort: Pulmonary effort is normal. No respiratory distress.     Breath sounds: Normal breath sounds.  Abdominal:     General: Abdomen is flat. Bowel sounds are normal.     Palpations: Abdomen is soft.  Musculoskeletal:        General: Normal range of motion.     Cervical back: Normal range of motion.     Right lower leg: No edema.  Left lower leg: No edema.  Lymphadenopathy:     Cervical: No cervical adenopathy.  Skin:    General: Skin is warm and dry.     Findings: No rash.  Neurological:     General: No focal deficit present.      Mental Status: He is alert and oriented to person, place, and time. Mental status is at baseline.     Deep Tendon Reflexes:     Reflex Scores:      Patellar reflexes are 2+ on the right side and 2+ on the left side. Psychiatric:        Mood and Affect: Mood normal.        Behavior: Behavior normal.        Thought Content: Thought content normal.        Judgment: Judgment normal.         At today's visit, we discussed treatment options, associated risk and benefits, and engage in counseling as needed.  Additionally the following were reviewed: Past medical records, past medical and surgical history, family and social background, as well as relevant laboratory results, imaging findings, and specialty notes, where applicable.  This message was generated using dictation software, and as a result, it may contain unintentional typos or errors.  Nevertheless, extensive effort was made to accurately convey at the pertinent aspects of the patient visit.    There may have been are other unrelated non-urgent complaints, but due to the busy schedule and the amount of time already spent with him, time does not permit to address these issues at today's visit. Another appointment may have or has been requested to review these additional issues.     Thomes Dinning, MD, MS

## 2023-10-15 LAB — GENITAL MYCOPLASMAS NAA, URINE

## 2023-10-16 ENCOUNTER — Encounter: Payer: Self-pay | Admitting: Family Medicine

## 2023-10-16 LAB — HCV AB W REFLEX TO QUANT PCR

## 2023-10-16 LAB — GENITAL MYCOPLASMAS NAA, URINE

## 2023-10-16 LAB — HCV INTERPRETATION

## 2023-10-17 ENCOUNTER — Other Ambulatory Visit: Payer: BC Managed Care – PPO

## 2023-10-17 ENCOUNTER — Other Ambulatory Visit: Payer: Self-pay

## 2023-10-17 DIAGNOSIS — Z113 Encounter for screening for infections with a predominantly sexual mode of transmission: Secondary | ICD-10-CM

## 2023-10-17 LAB — HEPATITIS B CORE ANTIBODY, TOTAL: Hep B Core Total Ab: NONREACTIVE

## 2023-10-17 LAB — HEPATITIS B SURFACE ANTIBODY,QUALITATIVE: Hep B S Ab: REACTIVE — AB

## 2023-10-17 LAB — HIV ANTIBODY (ROUTINE TESTING W REFLEX): HIV 1&2 Ab, 4th Generation: NONREACTIVE

## 2023-10-17 LAB — URINALYSIS W MICROSCOPIC + REFLEX CULTURE

## 2023-10-17 LAB — RPR: RPR Ser Ql: NONREACTIVE

## 2023-10-17 LAB — HEPATITIS B SURFACE ANTIGEN: Hepatitis B Surface Ag: NONREACTIVE

## 2023-10-20 ENCOUNTER — Telehealth: Payer: Self-pay

## 2023-10-20 NOTE — Telephone Encounter (Signed)
-----   Message from Garnette Gunner sent at 10/20/2023  1:13 PM EST ----- Can we add on the urine gc/chlamydia? ----- Message ----- From: Nell Range Lab Results In Sent: 10/20/2023   7:36 AM EST To: Garnette Gunner, MD

## 2023-10-20 NOTE — Telephone Encounter (Signed)
Fyi.Spoke with Marleen at [facility] and she stated that she put in the request to add on lab. She stated if they aren't able to they will fax over document stating why.

## 2023-10-21 LAB — GENITAL MYCOPLASMAS NAA, URINE: Mycoplasma genitalium NAA: NEGATIVE

## 2023-10-23 ENCOUNTER — Encounter: Payer: Self-pay | Admitting: Family Medicine

## 2023-10-23 NOTE — Telephone Encounter (Signed)
 Copied from CRM [id number]. Topic: Clinical - Lab/Test Results >> Oct 23, 2023  1:42 PM Fredrica W wrote: Reason for CRM: Patient called in and also sent message has further questions about lab results. Thank You

## 2023-11-11 LAB — SPECIMEN STATUS REPORT

## 2023-11-11 LAB — GC/CHLAMYDIA PROBE AMP
Chlamydia trachomatis, NAA: NEGATIVE
Neisseria Gonorrhoeae by PCR: NEGATIVE
